# Patient Record
Sex: Female | Born: 1935 | Race: Black or African American | Hispanic: No | Marital: Single | Smoking: Former smoker
Health system: Southern US, Community
[De-identification: ages and names within clinical notes are randomized; demographics above are authoritative.]

## PROBLEM LIST (undated history)

## (undated) DIAGNOSIS — E782 Mixed hyperlipidemia: Secondary | ICD-10-CM

## (undated) DIAGNOSIS — N186 End stage renal disease: Secondary | ICD-10-CM

## (undated) DIAGNOSIS — I2510 Atherosclerotic heart disease of native coronary artery without angina pectoris: Secondary | ICD-10-CM

## (undated) DIAGNOSIS — Z8679 Personal history of other diseases of the circulatory system: Secondary | ICD-10-CM

## (undated) DIAGNOSIS — E119 Type 2 diabetes mellitus without complications: Secondary | ICD-10-CM

## (undated) DIAGNOSIS — I10 Essential (primary) hypertension: Secondary | ICD-10-CM

## (undated) DIAGNOSIS — Z992 Dependence on renal dialysis: Secondary | ICD-10-CM

## (undated) DIAGNOSIS — D649 Anemia, unspecified: Secondary | ICD-10-CM

---

## 2001-12-24 HISTORY — PX: CORONARY ARTERY BYPASS GRAFT: SHX141

## 2001-12-24 HISTORY — PX: OTHER SURGICAL HISTORY: SHX169

## 2002-01-01 ENCOUNTER — Emergency Department (HOSPITAL_COMMUNITY): Admission: EM | Admit: 2002-01-01 | Discharge: 2002-01-01 | Payer: Self-pay | Admitting: Emergency Medicine

## 2002-01-06 ENCOUNTER — Inpatient Hospital Stay (HOSPITAL_COMMUNITY): Admission: AD | Admit: 2002-01-06 | Discharge: 2002-01-28 | Payer: Self-pay | Admitting: Internal Medicine

## 2002-01-07 ENCOUNTER — Encounter: Payer: Self-pay | Admitting: Internal Medicine

## 2002-01-16 ENCOUNTER — Encounter: Payer: Self-pay | Admitting: *Deleted

## 2002-01-22 ENCOUNTER — Encounter: Payer: Self-pay | Admitting: Surgery

## 2002-01-23 ENCOUNTER — Encounter: Payer: Self-pay | Admitting: Surgery

## 2002-01-24 ENCOUNTER — Encounter: Payer: Self-pay | Admitting: Surgery

## 2002-01-25 ENCOUNTER — Encounter: Payer: Self-pay | Admitting: Cardiothoracic Surgery

## 2002-02-11 ENCOUNTER — Ambulatory Visit (HOSPITAL_COMMUNITY): Admission: RE | Admit: 2002-02-11 | Discharge: 2002-02-11 | Payer: Self-pay | Admitting: Cardiology

## 2002-07-13 ENCOUNTER — Ambulatory Visit (HOSPITAL_COMMUNITY): Admission: RE | Admit: 2002-07-13 | Discharge: 2002-07-13 | Payer: Self-pay | Admitting: Surgery

## 2002-07-13 ENCOUNTER — Encounter: Payer: Self-pay | Admitting: Surgery

## 2002-08-06 ENCOUNTER — Encounter (HOSPITAL_COMMUNITY): Admission: RE | Admit: 2002-08-06 | Discharge: 2002-09-05 | Payer: Self-pay | Admitting: Nephrology

## 2002-09-10 ENCOUNTER — Encounter (HOSPITAL_COMMUNITY): Admission: RE | Admit: 2002-09-10 | Discharge: 2002-10-10 | Payer: Self-pay | Admitting: Nephrology

## 2002-09-18 ENCOUNTER — Encounter: Payer: Self-pay | Admitting: Anesthesiology

## 2002-09-21 ENCOUNTER — Ambulatory Visit (HOSPITAL_COMMUNITY): Admission: RE | Admit: 2002-09-21 | Discharge: 2002-09-21 | Payer: Self-pay | Admitting: Ophthalmology

## 2002-10-06 ENCOUNTER — Ambulatory Visit (HOSPITAL_COMMUNITY): Admission: RE | Admit: 2002-10-06 | Discharge: 2002-10-06 | Payer: Self-pay | Admitting: Otolaryngology

## 2002-10-06 ENCOUNTER — Encounter: Payer: Self-pay | Admitting: Otolaryngology

## 2002-10-15 ENCOUNTER — Encounter (HOSPITAL_COMMUNITY): Admission: RE | Admit: 2002-10-15 | Discharge: 2002-11-14 | Payer: Self-pay | Admitting: Nephrology

## 2002-11-18 ENCOUNTER — Encounter (HOSPITAL_COMMUNITY): Admission: RE | Admit: 2002-11-18 | Discharge: 2002-12-18 | Payer: Self-pay | Admitting: Nephrology

## 2002-12-23 ENCOUNTER — Encounter (HOSPITAL_COMMUNITY): Admission: RE | Admit: 2002-12-23 | Discharge: 2003-01-22 | Payer: Self-pay | Admitting: Oncology

## 2002-12-24 HISTORY — PX: EYE SURGERY: SHX253

## 2002-12-24 HISTORY — PX: OTHER SURGICAL HISTORY: SHX169

## 2003-01-25 ENCOUNTER — Ambulatory Visit (HOSPITAL_COMMUNITY): Admission: RE | Admit: 2003-01-25 | Discharge: 2003-01-25 | Payer: Self-pay | Admitting: Ophthalmology

## 2003-01-29 ENCOUNTER — Encounter (HOSPITAL_COMMUNITY): Admission: RE | Admit: 2003-01-29 | Discharge: 2003-02-28 | Payer: Self-pay | Admitting: Nephrology

## 2003-03-04 ENCOUNTER — Encounter (HOSPITAL_COMMUNITY): Admission: RE | Admit: 2003-03-04 | Discharge: 2003-04-03 | Payer: Self-pay | Admitting: Nephrology

## 2003-03-04 ENCOUNTER — Encounter: Admission: RE | Admit: 2003-03-04 | Discharge: 2003-03-04 | Payer: Self-pay | Admitting: Nephrology

## 2003-04-08 ENCOUNTER — Encounter (HOSPITAL_COMMUNITY): Admission: RE | Admit: 2003-04-08 | Discharge: 2003-05-08 | Payer: Self-pay | Admitting: Nephrology

## 2003-05-20 ENCOUNTER — Encounter (HOSPITAL_COMMUNITY): Admission: RE | Admit: 2003-05-20 | Discharge: 2003-06-19 | Payer: Self-pay | Admitting: Nephrology

## 2003-06-29 ENCOUNTER — Encounter (HOSPITAL_COMMUNITY): Admission: RE | Admit: 2003-06-29 | Discharge: 2003-07-29 | Payer: Self-pay | Admitting: Nephrology

## 2003-08-05 ENCOUNTER — Encounter (HOSPITAL_COMMUNITY): Admission: RE | Admit: 2003-08-05 | Discharge: 2003-09-04 | Payer: Self-pay | Admitting: Oncology

## 2003-09-03 ENCOUNTER — Encounter: Payer: Self-pay | Admitting: Emergency Medicine

## 2003-09-03 ENCOUNTER — Inpatient Hospital Stay (HOSPITAL_COMMUNITY): Admission: EM | Admit: 2003-09-03 | Discharge: 2003-09-06 | Payer: Self-pay | Admitting: Emergency Medicine

## 2003-09-09 ENCOUNTER — Encounter (HOSPITAL_COMMUNITY): Admission: RE | Admit: 2003-09-09 | Discharge: 2003-10-09 | Payer: Self-pay | Admitting: Nephrology

## 2003-10-14 ENCOUNTER — Encounter (HOSPITAL_COMMUNITY): Admission: RE | Admit: 2003-10-14 | Discharge: 2003-11-13 | Payer: Self-pay | Admitting: Nephrology

## 2003-10-25 ENCOUNTER — Ambulatory Visit (HOSPITAL_COMMUNITY): Admission: RE | Admit: 2003-10-25 | Discharge: 2003-10-25 | Payer: Self-pay | Admitting: Ophthalmology

## 2003-12-25 HISTORY — PX: EYE SURGERY: SHX253

## 2004-02-07 ENCOUNTER — Ambulatory Visit (HOSPITAL_COMMUNITY): Admission: RE | Admit: 2004-02-07 | Discharge: 2004-02-08 | Payer: Self-pay | Admitting: Ophthalmology

## 2004-05-25 ENCOUNTER — Encounter (HOSPITAL_COMMUNITY): Admission: RE | Admit: 2004-05-25 | Discharge: 2004-06-24 | Payer: Self-pay | Admitting: Nephrology

## 2004-06-29 ENCOUNTER — Encounter (HOSPITAL_COMMUNITY): Admission: RE | Admit: 2004-06-29 | Discharge: 2004-07-29 | Payer: Self-pay | Admitting: Nephrology

## 2004-08-02 ENCOUNTER — Ambulatory Visit (HOSPITAL_COMMUNITY): Admission: RE | Admit: 2004-08-02 | Discharge: 2004-08-02 | Payer: Self-pay | Admitting: Family Medicine

## 2004-08-03 ENCOUNTER — Encounter (HOSPITAL_COMMUNITY): Admission: RE | Admit: 2004-08-03 | Discharge: 2004-09-02 | Payer: Self-pay | Admitting: Nephrology

## 2004-08-16 ENCOUNTER — Ambulatory Visit (HOSPITAL_COMMUNITY): Admission: RE | Admit: 2004-08-16 | Discharge: 2004-08-16 | Payer: Self-pay | Admitting: Family Medicine

## 2004-09-07 ENCOUNTER — Encounter (HOSPITAL_COMMUNITY): Admission: RE | Admit: 2004-09-07 | Discharge: 2004-09-22 | Payer: Self-pay | Admitting: Nephrology

## 2004-09-19 ENCOUNTER — Emergency Department (HOSPITAL_COMMUNITY): Admission: EM | Admit: 2004-09-19 | Discharge: 2004-09-19 | Payer: Self-pay | Admitting: *Deleted

## 2004-09-28 ENCOUNTER — Encounter (HOSPITAL_COMMUNITY): Admission: RE | Admit: 2004-09-28 | Discharge: 2004-10-28 | Payer: Self-pay | Admitting: Nephrology

## 2004-11-02 ENCOUNTER — Ambulatory Visit (HOSPITAL_COMMUNITY): Payer: Self-pay | Admitting: Nephrology

## 2004-11-02 ENCOUNTER — Encounter (HOSPITAL_COMMUNITY): Admission: RE | Admit: 2004-11-02 | Discharge: 2004-12-02 | Payer: Self-pay | Admitting: Nephrology

## 2004-11-09 ENCOUNTER — Ambulatory Visit: Payer: Self-pay | Admitting: Gastroenterology

## 2004-11-20 ENCOUNTER — Ambulatory Visit (HOSPITAL_COMMUNITY): Admission: RE | Admit: 2004-11-20 | Discharge: 2004-11-20 | Payer: Self-pay | Admitting: Internal Medicine

## 2004-11-20 ENCOUNTER — Ambulatory Visit: Payer: Self-pay | Admitting: Internal Medicine

## 2004-11-27 ENCOUNTER — Ambulatory Visit (HOSPITAL_COMMUNITY): Admission: RE | Admit: 2004-11-27 | Discharge: 2004-11-27 | Payer: Self-pay | Admitting: Ophthalmology

## 2004-12-08 ENCOUNTER — Encounter (HOSPITAL_COMMUNITY): Admission: RE | Admit: 2004-12-08 | Discharge: 2005-01-07 | Payer: Self-pay | Admitting: Nephrology

## 2005-01-04 ENCOUNTER — Ambulatory Visit (HOSPITAL_COMMUNITY): Payer: Self-pay | Admitting: Nephrology

## 2005-01-11 ENCOUNTER — Encounter (HOSPITAL_COMMUNITY): Admission: RE | Admit: 2005-01-11 | Discharge: 2005-02-10 | Payer: Self-pay | Admitting: Nephrology

## 2005-02-16 ENCOUNTER — Encounter (HOSPITAL_COMMUNITY): Admission: RE | Admit: 2005-02-16 | Discharge: 2005-03-18 | Payer: Self-pay | Admitting: Nephrology

## 2005-03-01 ENCOUNTER — Ambulatory Visit (HOSPITAL_COMMUNITY): Payer: Self-pay | Admitting: Nephrology

## 2005-03-22 ENCOUNTER — Encounter (HOSPITAL_COMMUNITY): Admission: RE | Admit: 2005-03-22 | Discharge: 2005-04-21 | Payer: Self-pay | Admitting: Nephrology

## 2005-09-13 ENCOUNTER — Ambulatory Visit (HOSPITAL_COMMUNITY): Admission: RE | Admit: 2005-09-13 | Discharge: 2005-09-13 | Payer: Self-pay | Admitting: Nephrology

## 2005-09-20 ENCOUNTER — Ambulatory Visit (HOSPITAL_COMMUNITY): Admission: RE | Admit: 2005-09-20 | Discharge: 2005-09-20 | Payer: Self-pay | Admitting: Nephrology

## 2005-12-24 HISTORY — PX: OTHER SURGICAL HISTORY: SHX169

## 2006-05-29 ENCOUNTER — Ambulatory Visit (HOSPITAL_COMMUNITY): Admission: RE | Admit: 2006-05-29 | Discharge: 2006-05-29 | Payer: Self-pay | Admitting: Nephrology

## 2006-05-30 ENCOUNTER — Ambulatory Visit (HOSPITAL_COMMUNITY): Admission: RE | Admit: 2006-05-30 | Discharge: 2006-05-30 | Payer: Self-pay | Admitting: Vascular Surgery

## 2006-06-03 ENCOUNTER — Ambulatory Visit (HOSPITAL_COMMUNITY): Admission: RE | Admit: 2006-06-03 | Discharge: 2006-06-03 | Payer: Self-pay | Admitting: Vascular Surgery

## 2006-07-04 ENCOUNTER — Ambulatory Visit (HOSPITAL_COMMUNITY): Admission: RE | Admit: 2006-07-04 | Discharge: 2006-07-04 | Payer: Self-pay | Admitting: Family Medicine

## 2006-08-12 ENCOUNTER — Inpatient Hospital Stay (HOSPITAL_COMMUNITY): Admission: EM | Admit: 2006-08-12 | Discharge: 2006-08-15 | Payer: Self-pay | Admitting: Emergency Medicine

## 2006-08-13 ENCOUNTER — Ambulatory Visit: Payer: Self-pay | Admitting: Gastroenterology

## 2006-08-29 ENCOUNTER — Ambulatory Visit: Payer: Self-pay | Admitting: Internal Medicine

## 2006-08-29 ENCOUNTER — Ambulatory Visit (HOSPITAL_COMMUNITY): Admission: RE | Admit: 2006-08-29 | Discharge: 2006-08-29 | Payer: Self-pay | Admitting: Internal Medicine

## 2006-09-12 ENCOUNTER — Ambulatory Visit (HOSPITAL_COMMUNITY): Admission: RE | Admit: 2006-09-12 | Discharge: 2006-09-12 | Payer: Self-pay | Admitting: Internal Medicine

## 2006-09-12 ENCOUNTER — Encounter (INDEPENDENT_AMBULATORY_CARE_PROVIDER_SITE_OTHER): Payer: Self-pay | Admitting: *Deleted

## 2006-10-21 ENCOUNTER — Ambulatory Visit (HOSPITAL_COMMUNITY): Admission: RE | Admit: 2006-10-21 | Discharge: 2006-10-21 | Payer: Self-pay | Admitting: Vascular Surgery

## 2006-12-24 HISTORY — PX: OTHER SURGICAL HISTORY: SHX169

## 2007-02-07 ENCOUNTER — Ambulatory Visit: Payer: Self-pay | Admitting: Vascular Surgery

## 2007-02-07 ENCOUNTER — Ambulatory Visit (HOSPITAL_COMMUNITY): Admission: RE | Admit: 2007-02-07 | Discharge: 2007-02-07 | Payer: Self-pay | Admitting: Vascular Surgery

## 2007-02-18 ENCOUNTER — Ambulatory Visit (HOSPITAL_COMMUNITY): Admission: RE | Admit: 2007-02-18 | Discharge: 2007-02-18 | Payer: Self-pay | Admitting: Vascular Surgery

## 2007-06-30 ENCOUNTER — Ambulatory Visit (HOSPITAL_COMMUNITY): Admission: RE | Admit: 2007-06-30 | Discharge: 2007-06-30 | Payer: Self-pay | Admitting: Nephrology

## 2007-07-01 ENCOUNTER — Ambulatory Visit: Payer: Self-pay | Admitting: Vascular Surgery

## 2007-07-01 ENCOUNTER — Ambulatory Visit (HOSPITAL_COMMUNITY): Admission: RE | Admit: 2007-07-01 | Discharge: 2007-07-01 | Payer: Self-pay | Admitting: Vascular Surgery

## 2007-08-08 ENCOUNTER — Ambulatory Visit: Payer: Self-pay | Admitting: Vascular Surgery

## 2007-08-18 ENCOUNTER — Ambulatory Visit (HOSPITAL_COMMUNITY): Admission: RE | Admit: 2007-08-18 | Discharge: 2007-08-18 | Payer: Self-pay | Admitting: Vascular Surgery

## 2007-08-18 ENCOUNTER — Ambulatory Visit: Payer: Self-pay | Admitting: *Deleted

## 2007-12-29 IMAGING — CR DG ANG/EXT/UNI/OR LEFT
1 series · 1 of 1 positions shown · non-contrast
Comparison: none

CLINICAL DATA: Arteriogram, clotted graft. 
 PORTABLE INTRAOPERATIVE ANGIOGRAM ? 10/21/06:

[view not recorded]
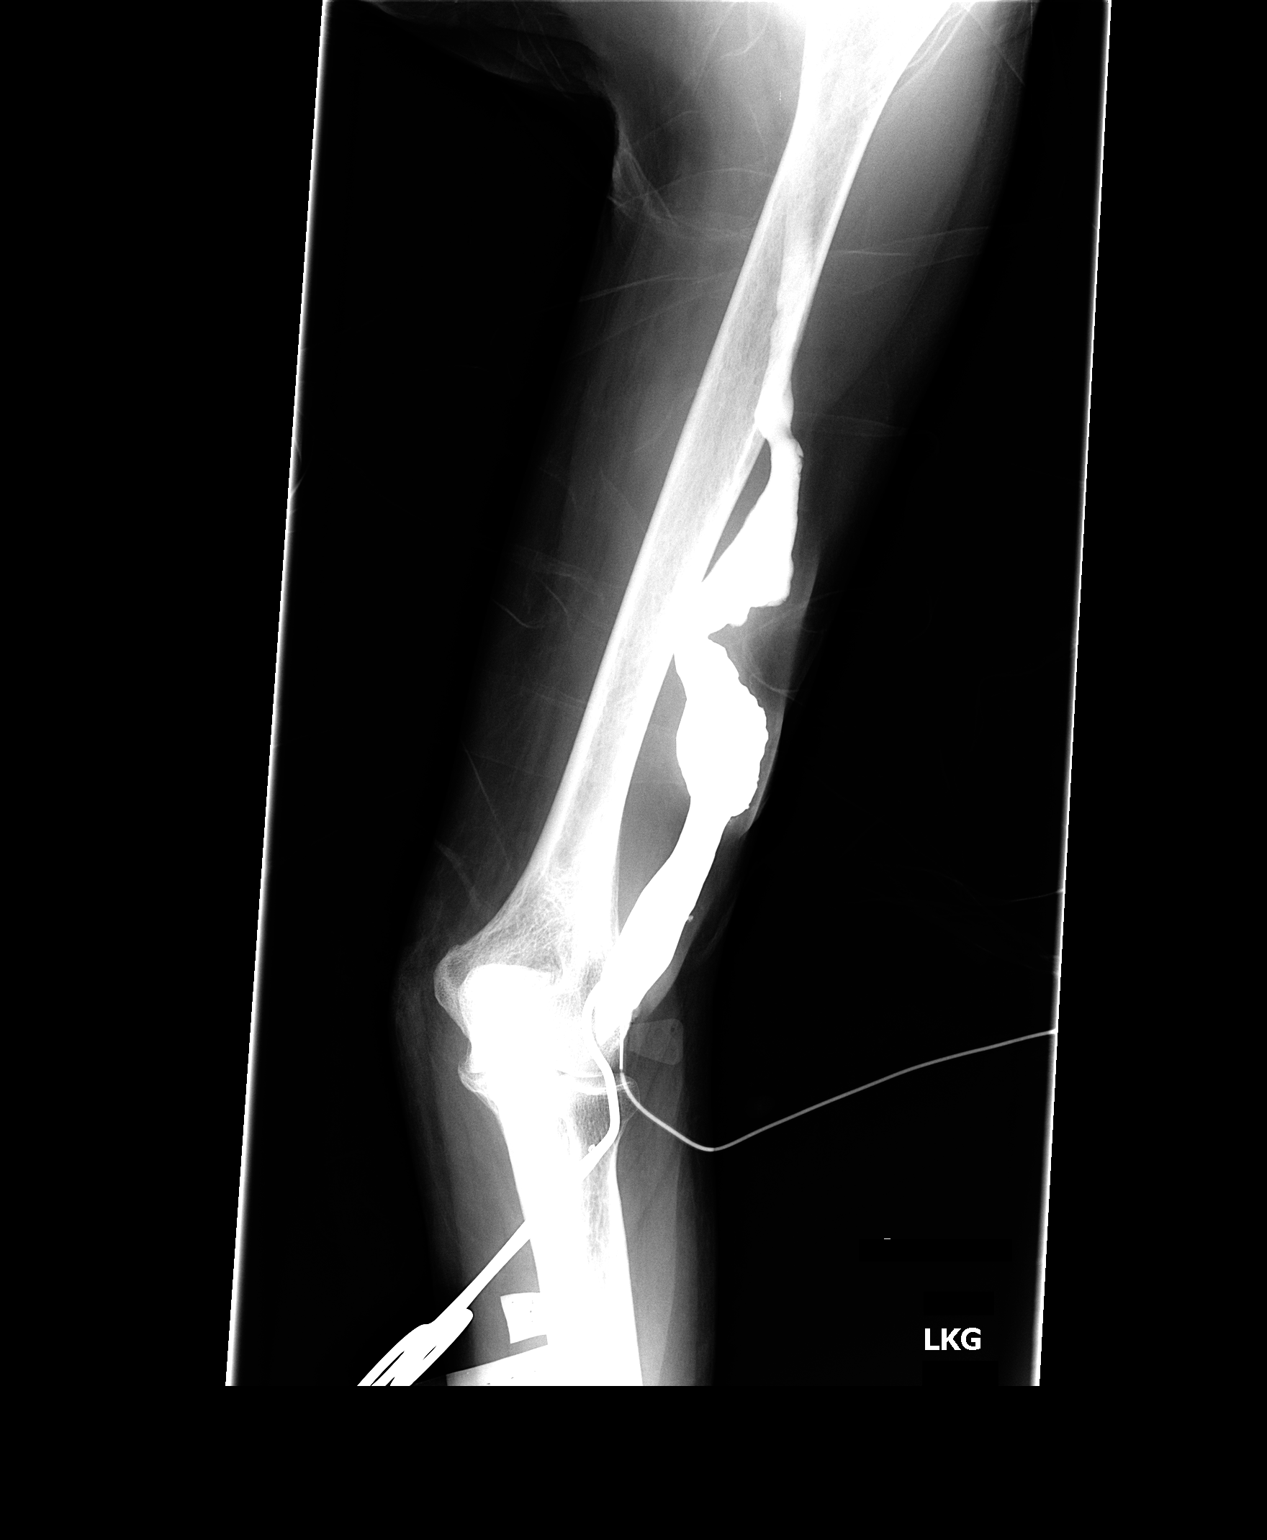

[1 of 1 positions shown; findings below may reference images not displayed]

FINDINGS: A single image demonstrates access of a graft with retrograde flow of contrast through what appears to be the venous portion of the AV graft toward the axilla.  Although there is tortuosity of the graft, it is patent.
IMPRESSION: Retrograde flow of contrast apparently within the venous outflow portion of an AV graft.

## 2008-01-23 ENCOUNTER — Ambulatory Visit (HOSPITAL_COMMUNITY): Admission: RE | Admit: 2008-01-23 | Discharge: 2008-01-23 | Payer: Self-pay | Admitting: General Surgery

## 2011-01-14 ENCOUNTER — Encounter: Payer: Self-pay | Admitting: Family Medicine

## 2011-05-08 NOTE — Op Note (Signed)
NAME:  Barbara Cummings, Barbara Cummings                ACCOUNT NO.:  [id number]   MEDICAL RECORD NO.:  [id number]          PATIENT TYPE:  AMB   LOCATION:  SDS                          FACILITY:  [facility]   PHYSICIAN:  Balinda Quails, M.D.    DATE OF BIRTH:  08/03/1927   DATE OF PROCEDURE:  08/18/2007  DATE OF DISCHARGE:  08/18/2007                               OPERATIVE REPORT   SURGEON:  Balinda Quails, M.D.   ASSISTANT:  Nurse.   ANESTHETIC:  Local with MAC.   ANESTHESIOLOGIST:  Smith.   PREOPERATIVE DIAGNOSIS:  Endstage renal failure.   POSTOPERATIVE DIAGNOSIS:  Endstage renal failure.   PROCEDURE.:  1. Right brachiocephalic arteriovenous fistula.  2. Exchange of right internal jugular Diatek catheter.   OPERATIVE PROCEDURE:  The patient was brought to the operating room in  stable condition.  Placed in supine position.  Right arm prepped and  draped in a sterile fashion.  Skin and subcutaneous tissue instilled  with 1% Xylocaine with epinephrine.  Transverse skin incision made  through the right antecubital fossa.  The right cephalic vein and  antecubital network were all dissected out.  The cephalic vein isolated  with tributaries ligated with silk ties and divided.  Deep to the  cephalic vein the brachial artery was exposed.  This was followed down  to the origin of the radial and ulnar arteries which were each encircled  with vessel loops.  The patient was administered 2000 units of heparin  intravenously.   The brachial artery controlled proximally and distally with bulldog  clamps.  A longitudinal arteriotomy made.  The cephalic vein divided,  ligated distally with 2-0 silk.  Dilated proximally.  Anastomosed end-to-  side to the brachial artery using running 7-0 Prolene suture.  Clamps  were then removed.  Excellent flow present.  Adequate hemostasis  obtained.  Sponge and instrument counts correct.   Subcutaneous tissue closed with running 3-0 Vicryl suture.  Skin closed  with 4-0  Monocryl.  Dermabond applied.   The right neck and chest were then prepped and draped in a sterile  fashion.  Skin and subcutaneous tissues instilled with 1% Xylocaine.  A  transverse skin incision was made over the tunnel of the right internal  jugular Diatek catheter.  The tunnel was opened.  The catheter clamped  with a hemostat and divided and the distal segment removed.  The  proximal segment was wired and the proximal segment entirely removed.  The wire was now in the superior vena cava.  A 16-French tearaway sheath  and dilator were advanced over the guidewire, the dilator and guidewire  removed.  A new Diatek catheter was placed through the sheath to the  superior vena cava right atrial junction.  The sheath removed.  Subcutaneous tunnel created, and the catheter brought through the  tunnel.  Divided the hub mechanism assembled, flushed with heparin  saline and capped with heparin.   Insertion site closed with interrupted 3-0 nylon suture.  Catheter  affixed to the skin with interrupted 2-0 silk suture.  Sterile dressings  applied.  The patient  tolerated the procedure well.  No apparent  complications.  Transferred to the recovery room in stable condition.      Balinda Quails, M.D.  Electronically Signed     PGH/MEDQ  D:  08/18/2007  T:  08/19/2007  Job:  [id number]

## 2011-05-08 NOTE — Procedures (Signed)
CEPHALIC VEIN MAPPING   INDICATION:  Evaluation of right cephalic vein prior to placement of  dialysis access.   HISTORY:  Thrombosed left AV fistula.  The patient currently dialyzes via a right  subclavian catheter.   EXAM:  Right cephalic vein is compressible.   Diameter measurements range from 0.15 cm to 0.26 cm.   Left cephalic vein not evaluated.   IMPRESSION:  Patent right cephalic vein which is of acceptable diameter  for use as dialysis access site.   The forearm portion of the cephalic vein appears slightly larger than  the upper arm portion of vein.   ___________________________________________  Larina Earthly, M.D.   MC/MEDQ  D:  08/08/2007  T:  08/09/2007  Job:  [id number]

## 2011-05-08 NOTE — H&P (Signed)
NAME:  Barbara Cummings, [place] NO.:  [id number]   MEDICAL RECORD NO.:  [id number]           PATIENT TYPE:   LOCATION:  DAY                           FACILITY:  [facility]   PHYSICIAN:  Tilford Pillar, MD      DATE OF BIRTH:  08/03/1927   DATE OF ADMISSION:  DATE OF DISCHARGE:  LH                              HISTORY & PHYSICAL   CHIEF COMPLAINT:  End-stage renal disease with a Perma-Cath.   HISTORY OF PRESENT ILLNESS:  The patient is a 77 year old female with a  history of end-stage renal disease, diabetes mellitus, coronary artery  disease, and hypercholesterolemia.  She had a prior left arm AV shunt  that became dilated and was no longer satisfactory for Monday,  Wednesday, Friday dialysis appointments.  Because it had become non  functional, she had a Perma-Cath placed into the right internal jugular  vein, approximately in August at which time her right arm had an AV  shunt created.  While waiting for maturation of the shunt, she continued  her dialysis through the Perma-Cath.  At this point her shunt has  matured to the point of utilization.  She has now been using the shunt  at dialysis and has not been utilizing the Perma-Cath other than for  occasional flushing's.  At this time patient presents for removal.   PAST MEDICAL HISTORY:  1. End-stage renal disease.  2. Coronary artery disease.  3. Diabetes mellitus.  4. Hypercholesterolemia.   SURGICAL HISTORY:  1. She had a CABG in 2003.  2. She had AV shunts in bilateral arms.  3. She had placement of a Perma-Cath in August, 2008.   MEDICATIONS:  1. Dilantin.  2. Simvastatin.  3. Actos.  4. Renagel.  5. Glipizide.  She is not on any anticoagulation.   ALLERGIES:  No known drug allergies.   SOCIAL HISTORY:  No tobacco use, no alcohol use or drug use.   REVIEW OF SYSTEMS:  Unremarkable in all systems.  She is anuric.   PHYSICAL EXAMINATION:  GENERAL:  The patient is a somewhat disheveled,  moderately obese  African-American female in no acute distress.  HEENT:  Scalp - no deformities, no masses. Eyes are somewhat  protuberant, pupils are equal, round and reactive, extraocular movements  are intact.  No scleral icterus is noted. Oral mucosa is pink. Normal occlusion.  NECK:  Trachea is midline. No cervical lymphadenopathy is appreciated.  LUNGS:  Unlabored respirations, clear to auscultation bilaterally.  CARDIOVASCULAR:  Regular rate and rhythm, she has 2+ radial pulses  bilaterally.  She has a palpable thrill in the right arm AV shunt. She  has a pulsatile flow through the left AV shunt with no thrill  appreciated.  CHEST WALL:  Evaluation notes she does have a Perma-Cath in the right  subclavicular region, the subcuticular tunneling of this appears to  enter into the lower aspect of the internal jugular vein on the right  side.   ASSESSMENT AND PLAN:  1. End stage renal disease, Perma-Cath which now requires removal. At      this point it  was discussed with the patient need for removal as      well as the risks, benefits and alternatives, including the      possible risk of bleeding.  Patient understands the risks and      wishes to proceed.  At this point we will attempt to get this      scheduled following a course of her dialysis.  Additionally she was      advised to notify her dialysis appointment to minimize heparin      usage for the anticipated Perma-Cath removal tomorrow.      Tilford Pillar, MD  Electronically Signed     BZ/MEDQ  D:  01/22/2008  T:  01/22/2008  Job:  [id number]   cc:   Ninetta Lights D. Felecia Shelling, MD  Fax: [phone]   SHORT STAY

## 2011-05-08 NOTE — Assessment & Plan Note (Signed)
OFFICE VISIT   Barbara Cummings, Barbara Cummings  DOB:  08/03/1927                                       08/08/2007  XBJYN#:[id number]   REASON FOR VISIT:  Evaluation for AV access.   She is a pleasant, 77 year old, black female whose been on renal  dialysis for several years. She had a left upper arm AV fistula created  approximately 5 years ago and has had a recent thrombosis and failure of  this. She has had a diatek catheter placed by Dr. Edilia Bo on July 01, 2007 which is working without difficulty. She does have radial pulses  bilaterally. She has a moderate size antecubital vein by physical exam.  She underwent duplex today showing patency of her cephalic vein which is  relatively small at the wrist and has a moderate size at the antecubital  space proximally. I recommended that we explore her right arm to  anticipate fistula on her right side since she has had a good result  from the left side. We will plan for surgery on a nondialysis on August  28 at [facility].   Larina Earthly, M.D.  Electronically Signed   TFE/MEDQ  D:  08/08/2007  T:  08/11/2007  Job:  [id number]

## 2011-05-08 NOTE — Op Note (Signed)
NAME:  Barbara Cummings, Barbara Cummings                ACCOUNT NO.:  [id number]   MEDICAL RECORD NO.:  [id number]          PATIENT TYPE:  AMB   LOCATION:  SDS                          FACILITY:  [facility]   PHYSICIAN:  Di Kindle. Edilia Bo, M.D.DATE OF BIRTH:  1926-04-27   DATE OF PROCEDURE:  07/01/2007  DATE OF DISCHARGE:                               OPERATIVE REPORT   PREOPERATIVE DIAGNOSIS:  Chronic renal failure.   POSTOPERATIVE DIAGNOSIS:  Chronic renal failure.   PROCEDURE:  Ultrasound-guided placement of a right IJ Diatek catheter.   SURGEON:  Di Kindle. Edilia Bo, M.D.   ASSISTANT:  Nurse.   ANESTHESIA:  Local with sedation.   TECHNIQUE:  The patient was taken to the operating room and sedated by  anesthesia.  The ultrasound scanner was used to mark both internal  jugular veins, both of which appeared to be patent.  The neck and upper  chest were then prepped and draped in the usual sterile fashion.   After the skin was infiltrated with 1% lidocaine, the right IJ was  cannulated and a guidewire introduced into the superior vena cava under  fluoroscopic control.  The tract over the wire was dilated, and then a  dilator and peel-away sheath were passed over the wire and the wire and  dilator removed.  The catheter was passed through the peel-away sheath  and positioned in the right atrium.  The exit site for the catheter was  selected and the skin anesthetized between the two areas.  The catheter  was then brought through the tunnel, cut to the appropriate length, and  the distal ports were attached.  Both ports withdrew easily, were then  flushed with heparinized saline and filled with concentrated heparin.  The catheter was secured at its exit site with a 3-0 nylon suture.  The  IJ cannulation site was closed with 4-0 subcuticular stitch.  A sterile  dressing was applied.   The patient tolerated the procedure well and was transferred to the  recovery room in satisfactory condition.   All needle and sponge counts  were correct.      Di Kindle. Edilia Bo, M.D.  Electronically Signed     CSD/MEDQ  D:  07/01/2007  T:  07/01/2007  Job:  [id number]

## 2011-05-11 NOTE — Op Note (Signed)
NAME:  Barbara Cummings, Barbara Cummings                            ACCOUNT NO.:  [id number]   MEDICAL RECORD NO.:  [id number]                   PATIENT TYPE:  OIB   LOCATION:  2550                                 FACILITY:  [facility]   PHYSICIAN:  Alford Highland. Rankin, M.D.                DATE OF BIRTH:  08/03/1927   DATE OF PROCEDURE:  02/07/2004  DATE OF DISCHARGE:                                 OPERATIVE REPORT   PREOPERATIVE DIAGNOSIS:  1. Tractional detachment left eye.  2. Combined rhegmatogenal detachment left eye.  3. Progressive proliferative diabetic retinopathy of the left eye     threatening macular detachment.   POSTOPERATIVE DIAGNOSIS:  1. Tractional detachment left eye.  2. Combined rhegmatogenal detachment left eye.  3. Progressive proliferative diabetic retinopathy of the left eye     threatening macular detachment.   PROCEDURE:  1. Posterior vitrectomy with membrane peel left eye.  2. Endolaser pan photocoagulation left eye.  3. Injection of vitreous substitute - C3F8 12% left eye.   SURGEON:  Alford Highland. Rankin, M.D.   ANESTHESIA:  General endotracheal.   INDICATIONS FOR PROCEDURE:  The patient is a 77 year old woman with profound  vision loss of the left eye on the basis of neglected and progressive  proliferative diabetic retinopathy of the left eye, neovascular tissues  causing tractional detachment of the thready macular region and causing  detachments nasally and inferiorly.  This is an attempt to release the  tractional areas.  The patient understands the risks of anesthesia including  rare occurrence of death, but also to the eye including, but not limited to  hemorrhage, infection, scarring, need for another surgery, no change in  vision, loss of vision, and progressive disease despite intervention.  Appropriate signed consent was obtained.  She was taken to the operating  room.   DESCRIPTION OF PROCEDURE:  In the operating room, appropriate monitoring was  followed by general  endotracheal anesthesia.  The left periocular region was  thoroughly prepped and draped in the usual ophthalmic fashion.  Lid speculum  applied.  Conjunctival peritomy was then fashioned in each of the quadrants  with the exception of the inferonasal.  4 mm infusion secured 3.5 mm  posterior to the limbus.  Placement in the vitreous cavity verified  visually.  Superior sclerotomies were fashioned.  Wild microscope was placed  into position with the Biome attachment.  Core vitrectomy was then begun.  Modified en block technique was then used in a delamination of the  neovascular tissues along the optic nerve and temporal arcades.  Dense  attachments were noted nasally.  The neovascular tissue and the old fibrotic  tissue over the optic nerve were identified.  Radial incisions were made in  the remainder of the posterior hyaloidal remnants in the quadrants extending  from the 10 o'clock position to the 7 o'clock position because of their  dense adherent nature to  what appeared to be necrotic retina.  A small  retinotomy was fashioned nasal to the optic nerve for internal drainage of  subretinal fluid.  The remainder of the peripheral vitreous had been trimmed  and excised 360 disease.  The macular area was freed from its tractional  detachments.   At this time, fluid/air exchange was then completed.  The retina nicely  reattached.  Endolaser photocoagulation was placed 360 degrees.  An air-C3F8  exchange was completed using a concentration of 12% C3F8.  The instruments  were removed from the eye. Superior sclerotomies were closed with 7-0  Vicryl.  The infusion removed and similarly closed with 7-0 Vicryl as well  as the conjunctiva.  Subconjunctival injections of antibiotic steroid  applied.  Sterile patch and Fox shield applied.  Intraocular pressure had  been assessed and found to be adequate. The patient was taken to the  recovery room in good stable condition.                                                Alford Highland Rankin, M.D.    GAR/MEDQ  D:  02/07/2004  T:  02/07/2004  Job:  [id number]

## 2011-05-11 NOTE — Op Note (Signed)
NAME:  Barbara Cummings, Barbara Cummings                ACCOUNT NO.:  [id number]   MEDICAL RECORD NO.:  [id number]          PATIENT TYPE:  AMB   LOCATION:  SDS                          FACILITY:  [facility]   PHYSICIAN:  Quita Skye. Hart Rochester, M.D.  DATE OF BIRTH:  08/03/1927   DATE OF PROCEDURE:  10/21/2006  DATE OF DISCHARGE:  10/21/2006                                 OPERATIVE REPORT   PREOPERATIVE DIAGNOSIS:  Thrombosed left upper arm AV fistula.   POSTOPERATIVE DIAGNOSIS:  Thrombosed left upper arm AV fistula.   OPERATIONS:  Thrombectomy, left upper arm AV fistula with intraoperative  fistulogram.   SURGEON:  Quita Skye. Hart Rochester, M.D.   FIRST ASSISTANT:  Jerold Coombe, P.A.   ANESTHESIA:  Local.   PROCEDURE:  The patient was taken to the operating room, placed in the  supine position at which time the left upper extremity was prepped with  Betadine scrub and solution and draped in a routine sterile manner.  After  infiltration of 1% Xylocaine with epinephrine.  Incision was made through  the previous scar in the left upper arm.  This was in the distal aspect of  the upper arm overlying the cephalic vein to brachial artery anastomosis.  The vein was quite dilated and pulsatile about 4 to 5 cm where it became  thrombosed.  There were two areas of or significant dilatation which were  also thrombosed to the shoulder level.  Transverse opening was made in the  very proximal portion of the cephalic vein about 3 cm from the arterial  anastomosis after gaining proximal control.  This was filled with fresh  thrombus.  A 5 Fogarty catheter would easily traverse the fistula into the  central veins and upon return a long core of organized thrombus was  retrieved.  Additional passes yielded a little but more thrombus but  eventually there were multiple negative passes and heparin saline would be  flushed under low resistance.  The arterial anastomosis was also explored  was widely patent.  The fistula was reclosed  with continuous 6-0 Prolene  suture.  Clamp released.  There was an excellent pulse and a palpable thrill  with good Doppler flow in the fistula.  Intraoperative fistulogram was  performed which revealed the cephalic vein to be significantly dilated and  tortuous but there were no areas of significant stenosis noted.  Hemostasis  was achieved.  The wound was closed in layers with Vicryl in a subcuticular  fashion.  Sterile dressing applied.  The patient taken to recovery room in  satisfactory condition.           ______________________________  Quita Skye Hart Rochester, M.D.     JDL/MEDQ  D:  10/21/2006  T:  10/22/2006  Job:  [id number]

## 2011-05-11 NOTE — Discharge Summary (Signed)
[place]  Patient:    Barbara Cummings, Barbara Cummings Visit Number: [id number] MRN: [id number]          Service Type: MED Location: [id number] 2029 01 Attending Physician:  Cleatrice Burke Dictated by:   Areta Haber, P.A.C. Admit Date:  01/15/2002 Discharge Date: 01/28/2002   CC:         Gerrit Friends. Dietrich Pates, M.D. [facility]   Discharge Summary  HISTORY OF PRESENT ILLNESS:  This is a 77 year old woman with a history of non-insulin-dependent diabetes mellitus, hypertension, and chronic renal insufficiency who was admitted to [facility] on January 06, 2002, due to marked bilateral lower extremity edema to the knees with bilateral lower leg blisters and ulcerations with cellulitis.  She was treated with intravenous antibiotics and diuresis with improvement. An echocardiogram was obtained which showed a left ventricular ejection fraction reduced to 35 to 40% with global hypokinesis.  Also noted was trace aortic insufficiency and mild mitral regurgitation, trace tricuspid regurgitation.  On questioning, the patient denied any chest pain or pressure. She denied shortness of breath although her daughter stated that she occasionally would appear short of breath.  She also had no pain in her neck or jaw as well as none in her arms. She denied PND and/or orthopnea.  She had no palpitations. She was transferred to [facility] for further cardiac evaluation including catheterization.  PAST MEDICAL HISTORY:  Significant for type 2 diabetes mellitus for approximately five or six years and initially treated with an oral hypoglycemic agent. This was discontinued by her primary care physician due to low sugars.  Other diagnoses include hypertension for which she has been noncompliant, history of renal insufficiency diagnosis in January of 2003 where her creatinine was noted to be 3.4 with a BUN of 27. She also has a history of recently diagnosed  normocytic anemia.  MEDICATIONS PRIOR TO ADMISSION: 1. Lasix 20 mg daily. 2. Potassium chloride 10 mEq q.d. 3. Clonidine 0.1 mg b.i.d. *All started in early January.  For family history, social history, physical examination and review of systems, please see the history and physical done at the time of admission.  HOSPITAL COURSE:  The patient was transferred from [facility] to [facility] as stated.  She was taken to the cardiac catheterization lab on January 19, 2002, where she was found to have severe multivessel coronary artery disease which was quite diffuse in nature.  There was a 40% distal left main stenosis. The LAD was calcified proximally.  There was a 90% proximal and mid stenosis in the LAD. The left circumflex gave off a large bifurcating first marginal and 80% and 90% stenosis in the two sub-branches.  The continuation of left circumflex had a 50% stenosis, but was small distally. The right coronary artery had an 80% proximal and a 70% mid artery lesion. There was posterior descending coronary artery that had a 70% mid stenosis and a first posterolateral branch that had a 70% mid stenosis. The second posterolateral branch had a 70% stenosis.  The left ventriculogram was not performed due to elevated creatinine.  Due to these findings, surgical consultation was obtained with Alleen Borne, M.D., who evaluated the patient and studies and agreed with recommendations to proceed with surgical revascularization.  PROCEDURE:  On January 22, 2002, the patient was taken to the cardiac operating room where she underwent the following procedure:  Coronary artery bypass grafting x4 using left internal mammary artery  to the left anterior descending coronary artery with a saphenous vein graft to the posterior descending branch of the right coronary artery and a sequential saphenous vein graft to the first and second obtuse marginal branches of the left  circumflex coronary artery.  The patient tolerated the procedure well and was taken to the surgical intensive care unit in stable condition.  Intraoperative findings included severe diffuse calcific coronary artery disease.  POSTOPERATIVE HOSPITAL COURSE:  The patient has done quite well. She has maintained stable hemodynamics.  All routine lines, monitors and tubes have been discontinued in a stepwise manner.  Her postoperative creatinine has stabilized, most recently measured at 3.3 on January 26, 2002.  Additionally, she has had a postoperative anemia that has required transfusion.  Most recent hemoglobin and hematocrit dated January 26, 2002, are 8.5 and 24.7, respectively.  The patient will have both of these results checked prior to final decision on discharge.  Additionally, postoperatively the patient has had episode of atrial fibrillation but quickly converted back to normal sinus rhythm and is maintaining a stable rhythm for the past several days. She has tolerated routine cardiac rehabilitation phase I modalities.  Her incisions are healing well without signs of infection.  Her diabetes has been under fairly stable control but she did require a sliding scale and subsequently Actos was started for better diabetic control. She is familiar with checking her glucose at home and will continue to do so.  Currently, the patient is felt to be quite stable. She is tentatively felt ready for discharge the morning of January 28, 2002, pending morning round reevaluation.  MEDICATIONS ON DISCHARGE: 1. Aspirin 325 mg q.d. 2. Catapres 0.1 mg b.i.d. 3. Toprol XL 25 mg daily. 4. Actos 15 mg q.a.m. 5. Darvocet p.r.n.  FINAL DIAGNOSIS:  Coronary artery disease with congestive heart failure and left ventricular dysfunction.  OTHER DIAGNOSES: 1. Chronic renal insufficiency. 2. Diabetes mellitus type 2. 3. Hypertension. 4. History of recent diagnosis of bullous pemphigus. 5. History of  recent diagnosis of normocytic anemia and also postoperative    anemia.   FOLLOW-UP:  This will include Alleen Borne, M.D., in three weeks, February 17, 2002, at 9 a.m.; Gerrit Friends. Dietrich Pates, M.D., on February 10, 2002, at 10 a.m.  DISCHARGE INSTRUCTIONS: The patient will receive written instructions with regard to medications, activity, diet, wound care, and follow-up. Dictated by:   Areta Haber, P.A.C. Attending Physician:  Cleatrice Burke DD:  01/27/02 TD:  01/28/02 Job: [id number]

## 2011-05-11 NOTE — Op Note (Signed)
   NAME:  Barbara Cummings, Barbara Cummings                            ACCOUNT NO.:  [id number]   MEDICAL RECORD NO.:  [id number]                   PATIENT TYPE:  INP   LOCATION:  A208                                 FACILITY:  [facility]   PHYSICIAN:  Dirk Dress. Katrinka Blazing, M.D.                DATE OF BIRTH:  08/03/1927   DATE OF PROCEDURE:  DATE OF DISCHARGE:                                 OPERATIVE REPORT   PREOPERATIVE DIAGNOSIS:  Perirectal abscess.   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   PROCEDURE:  Wide excision of pilonidal disease with marsupialization.   SURGEON:  Dirk Dress. Katrinka Blazing, M.D.   DESCRIPTION:  Under spinal anesthesia the patient's presacral and perianal  area were prepped and draped in a sterile field. A probe was placed in the  superior most portion of the inflammatory process overlying the upper sacrum  and lower lumbar area.  The probe extended down to the draining fistulous  tract in the posterior rectal space.  Using electrocautery and staying away  from the inflamed tissue the area of disease was widely excised down to  normal tissue including fat and fascia.  None of the infected tissue  contaminated the operative site.  In the lower margins in the perirectal  space, however, there was always the potential for contamination because of  stool.   The area was irrigated and hemostasis was achieved.  The wound edges in the  upper or cephalad three/fourths of the incision were closed with interrupted  0 Biosyn in the subcutaneous tissue and 3-0 nylon on the skin.  The portion  closest to the rectum was left open and closed loosely with 3-0 Biosyn.  Dressing was placed.  The patient tolerated the procedure well. She was  transferred to a bed and taken to the postanesthetic care unit for further  monitoring.                                               Dirk Dress. Katrinka Blazing, M.D.    LCS/MEDQ  D:  09/04/2003  T:  09/04/2003  Job:   [id number]

## 2011-05-11 NOTE — Consult Note (Signed)
[facility]  Patient:    Barbara Cummings, Barbara Cummings Visit Number: [id number] MRN: [id number]          Service Type: MED Location: 3A A304 01 Attending Physician:  Elliot Cousin Dictated by:   Maudry Diego, M.D. Proc. Date: 01/12/02 Admit Date:  01/06/2002                            Consultation Report  REASON FOR CONSULTATION:  Renal insufficiency.  HISTORY OF PRESENT ILLNESS:  Barbara Cummings is a 77 year old African-American with past medical history of type 2 diabetes and hypertension presently admitted because of swelling, cellulitis, and blistering of her legs.  Barbara Cummings was told she has hyperglycemia possibly about six years ago, and she was started on medication.  Since then, the patient was not taking any medication.  The patient also states that she had hypertension for which she was given medication, but she was lost to followup after her primary physician left. Hence, according to her, the patient did not see any physician in the last five years.  The last time, she was seen in the emergency room because of swelling and blisters of her legs.  In the emergency room, she was found to have an elevated creatinine of 3.1.  The patient denies any previous history of renal insufficiency, kidney stone, or infection.  She denies any history of renal insufficiency.  The patient denies any nausea or vomiting.  PAST MEDICAL HISTORY:  As stated above, the patient has a history of type 2 diabetes only treated for a short period and no medication for the last five to six years.  History of hypertension of the same duration and not treated. History of bullous impetigo or cellulitis, renal insufficiency of unknown duration, and anemia.  MEDICATIONS: 1. ______ 1000 mg IV q.12h. 2. Diltiazem 180 mg p.o. q.d. 3. Lasix 20 mg p.o. q.d. 4. Kay-Ciel 10 mEq p.o. q.d. 5. Hemocyte 1 tablet q.d. 6. Aspirin q.d. 7. Toprol XL 12.5 mg p.o. q.a.m.  She was stopped ______   also was discontinued.  ALLERGIES:  No known drug allergies.  SOCIAL HISTORY:  The patient denies any alcohol or drug abuse.  No history of renal insufficiency in the family.  REVIEW OF SYSTEMS:  No fevers, chills, or sweating.  The patient in general claims to feel good, no complaints.  No history of nausea, vomiting, or weight loss.  PHYSICAL EXAMINATION:  VITAL SIGNS:  Blood pressure 159/88.  GENERAL:  The patient is somewhat emaciated.  HEENT:  Muddy sclerae, nonicteric.  Oral mucosa seems to be somewhat dry.  NECK:  Supple.  No JVD, no bruit.  CHEST:  Decreased breath sounds.  No rales and no rhonchi.  HEART:  Regular rate and rhythm.  ABDOMEN:  Soft.  Positive bowel sounds.  EXTREMITIES:  Right foot has some healed scar.  Left foot some superficial looking, large, multiple ulcers without drainage.  LABORATORY DATA:  White blood cell count 4.7, hemoglobin 10.3, hematocrit 30.4. Sodium 138, potassium 5.8, glucose 118, BUN 45, creatinine 3.6, total protein 5.9, albumin 2.6.   UA from January 06, 2002, specific gravity 1.015, pH 5, protein 13 mg/dl, leukocytes esterase positive.  Her creatinine clearance which was done on January 06, 2002, was 14 cc per minute when her creatinine was 3.2.  ASSESSMENT: 1. Renal insufficiency at this moment, no previous history.  However, the    patients creatinine has been increasing since she was  admitted, and her    ultrasound of the kidney showed one of her kidneys to be 11.8 cm and the    right one to be 9.4 cm without hydronephrosis and with loss of ______    differentiation; hence, consistent with possible chronic renal    insufficiency.  At this moment, etiology for her renal insufficiency is not    clear, but in a patient who has severe hypertension, renal insufficiency    with equal kidneys, possibility of renal artery stenosis should be    considered.  Since the patient also has a history of type 2 diabetes with     proteinuria, even though the patient denies any history of diabetic    retinopathy, diabetic nephropathy should always be included in the    differential.  However, lack of significant proteinuria and lack of    diabetic retinopathy and neuropathy works against this diagnosis.  Other    possible etiology in a patient with hypertension could be hypertensive    nephrosclerosis.  At this moment, her renal insufficiency seems to be    chronic because of the renal finding and lack of improvement in her    creatinine and presence of anemia. 2. Cardiomyopathy.  Etiology not clear.  Ejection fraction around 35%,    presently being worked up by cardiology. 3. Hypertension, primary versus secondary to renal insufficiency.  Presently    her blood pressure is slightly high.  She is on diltiazem and also on    Toprol.  This could be secondary to renal insufficiency or renal artery    stenosis. 4. Anemia.  Multifactorial including renal insufficiency.  Rule out    iron-deficiency anemia. 5. Bullae and cellulitis of her leg, etiology not clear.  Apparently she is    on antibiotics and seems to be improving. 6. Type 2 diabetes.  The patient previously was on hypoglycemic agent.    Presently her diabetes is controlled with diet only. 7. Ultrasound showed some complex cysts clearly not defined in a patient    who looks emaciated.  Need to consider renal cancer or simple cyst.  RECOMMENDATIONS:  At this moment, possibly may need to discontinue Lasix as her creatinine is worsening and also we may need to do MRA to rule out renal artery stenosis.  The patient may need to be evaluated by ophthalmologist.  If the patient is found to have diabetic retinopathy, then will assume this renal insufficiency is secondary to that, and we will not do any further workup. Possibly we may consider an access for possible dialysis.  If she does not have diabetic retinopathy, this may not rule out diabetes.  Other  etiology should be considered in this circumstance.  I agree with her hypertension  medication.  If she does not have iron-deficiency anemia, we may consider starting her on Epogen.  I will follow the patient with you.  Thank you. Dictated by:   Maudry Diego, M.D. Attending Physician:  Elliot Cousin DD:  01/12/02 TD:  01/13/02 Job: [id number]

## 2011-05-11 NOTE — Op Note (Signed)
NAME:  Barbara Cummings, Barbara Cummings                ACCOUNT NO.:  [id number]   MEDICAL RECORD NO.:  [id number]          PATIENT TYPE:  AMB   LOCATION:  SDS                          FACILITY:  [facility]   PHYSICIAN:  Larina Earthly, M.D.    DATE OF BIRTH:  08/03/1927   DATE OF PROCEDURE:  05/30/2006  DATE OF DISCHARGE:  05/30/2006                                 OPERATIVE REPORT   PREOPERATIVE DIAGNOSIS:  occluded left upper arm AV fistula.   POSTOPERATIVE DIAGNOSIS:  occluded left upper arm AV fistula.   PROCEDURE:  Thrombectomy of the left upper arm AV fistula.   SURGEON:  Larina Earthly, M.D.   ASSISTANT:  Constance Holster, P.A.-C.   ANESTHESIA:  Is MAC.   COMPLICATIONS:  None.  Patient taken to recovery room stable.   PROCEDURE IN DETAIL:  The patient was taken to the operating room and placed  in prone position. The area of the left arm prepped and draped in usual  sterile fashion.  Incision was made near the antecubital space to the prior  cephalic vein to brachial artery fistula.  The vein was a very large-caliber  at this area and was opened transversely.  There was a great deal of  thrombus in the entire vein from the antecubital space all the way up to the  level of the shoulder.  This was all removed, and there was good venous  backbleeding.  On removing the Fogarty catheter, there did appear to be  stenosis in the subclavian region on removing the Fogarty catheter.  This  was flushed with heparinized saline.  Next, the arterial anastomosis was  thrombectomized with excellent inflow.  The vein was reoccluded, and the  incision in the vein was closed with a running 6-0 Prolene suture.  Clamps  removed.  Pulsatile flow was noted but did have Doppler diastolic flow as  well.  Wounds were irrigated with saline.  Hemostasis with cautery.  The  wounds were closed with 3-0 Vicryl in the subcutaneous and subcuticular  tissue.  Benzoin and Steri-Strips were applied.  The patient will be  scheduled  for a shuntogram as soon as possible, since she undoubtedly has  recurrent stenosis at a prior angioplasty site in her central cephalic vein  subclavian area.      Larina Earthly, M.D.  Electronically Signed     TFE/MEDQ  D:  05/30/2006  T:  05/31/2006  Job:  [id number]

## 2011-05-11 NOTE — Cardiovascular Report (Signed)
[place]. [facility]  Patient:    Barbara Cummings, Barbara Cummings Visit Number: [id number] MRN: [id number]          Service Type: MED Location: [phone] Attending Physician:  Daisey Must Dictated by:   Everardo Beals Juanda Chance, M.D. [facility] Proc. Date: 01/19/02 Admit Date:  01/15/2002   CC:         Dietrich Pates, M.D. [facility]  Elliot Cousin, M.D.  Cardiopulmonary Laboratory   Cardiac Catheterization  PROCEDURES PERFORMED: Cardiac catheterization.  CLINICAL HISTORY: The patient is 77 years old and was admitted to [facility] with pemphigus and cellulitis, and was found to have renal insufficiency and positive troponins on admission. She had an echocardiogram which showed an ejection fraction of 35-40% and she had a Cardiolite scan which showed inferior and apical ischemia. She had been previously scheduled for cardiac catheterization. Because she had not been given Mucomyst and fluids, this was postponed until today. She was hydrated with fluids and Mucomyst was given preprocedure and our goal was to minimize contrast at the time of the procedure. Her baseline creatinine was 3.2.  DESCRIPTION OF PROCEDURE: The procedure was performed via the right femoral artery using an arterial sheath and 6 French preformed coronary catheters.  A front wall arterial puncture was performed and Omnipaque contrast was used. We took four pictures of the left coronary artery and one picture of the right coronary artery and did not do left ventriculography. We did pass the pigtail to the left ventricle to measure pressures. A total of 40 cc of contrast were used. The patient tolerated the procedure well and left the laboratory in satisfactory condition.  RESULTS: Aortic pressure was 182/79 with a mean of 115. Left ventricular pressure was 182/19.  The left main coronary artery: The left main coronary artery had a 40% narrowing in its distal portion.  Left anterior descending: The left  anterior descending artery was moderately heavily calcified and had a 90% and 80% stenosis proximally, a 90% mid section and a 50% distal stenosis. The LAD gave rise to two small diagonal branches and a septal perforator in the midportion.  Circumflex artery: The circumflex artery gave rise to a marginal branch and two posterolateral branches. There was 50% narrowing in the proximal circumflex artery just after the marginal branch. There was 80% narrowing in one of the sub-branches of the marginal branch and there was 80% distal narrowing in the other sub-branch.  Right coronary artery: The right coronary is a dominant vessel that supplied two posterior descending and two posterolateral branches. There was 80% proximal stenosis and 70% stenosis in the distal vessel. There were 70% stenosis in the posterior descending branch in its distal portion, and there were 70% stenosis in the first posterolateral branch in its distal portion in the second posterolateral branch in its proximal portion.  No left ventriculogram was performed.  CONCLUSIONS: Severe three-vessel coronary artery disease with 40% narrowing of the distal left main coronary artery, 90% and 80% proximal and 90% mid section of the left anterior descending artery, 50% proximal stenosis in the circumflex artery with 80% stenosis in a sub-branch of the marginal vessel, 80% proximal and 70% distal stenosis in the right coronary artery.  RECOMMENDATIONS: The patient has severe three-vessel coronary artery disease. I think she needs revascularization and I think we will have to accept the fact that she will need to go on dialysis sooner rather than later if she is agreeable to this approach. We will plan to obtain  surgical consultation. Her operative risks will be increased due to her age, diabetes, renal insufficiency, and left ventricular dysfunction. If the surgeons feel her operative risk is too high, we may consider  intervention on her LAD. This also would likely result in a need for dialysis. Dictated by:   Everardo Beals Juanda Chance, M.D. [facility] Attending Physician:  Daisey Must DD:  01/19/02 TD:  01/19/02 Job: [id number]

## 2011-05-11 NOTE — Discharge Summary (Signed)
NAME:  [name], Barbara Cummings                            ACCOUNT NO.:  [id number]   MEDICAL RECORD NO.:  [id number]                   PATIENT TYPE:  INP   LOCATION:  A208                                 FACILITY:  [facility]   PHYSICIAN:  Dirk Dress. Katrinka Blazing, M.D.                DATE OF BIRTH:  08/03/1927   DATE OF ADMISSION:  09/03/2003  DATE OF DISCHARGE:  09/06/2003                                 DISCHARGE SUMMARY   DISCHARGE DIAGNOSES:  Pilonidal abscess with perirectal extension.   SPECIAL PROCEDURE:  Wide excision of pilonidal abscess with  marsupialization.   DISPOSITION:  The patient was discharged home in stable and satisfactory  condition.   DISCHARGE MEDICATIONS:  1. Hydralazine 50 mg q.8h.  2. Clonidine 0.2 mg b.i.d.  3. Actos 30 mg every day.  4. Tiazac 420 mg every day.  5. Zocor 20 mg q.h.s.  6. PhosLo 667 mg, two tabs t.i.d.  7. Sodium bicarbonate 650 mg b.i.d.  8. Keflex 500 mg q.i.d.  9. Darvocet-N one q.4h. p.r.n.  10.      Hemocyte Plus one every day.   The patient is scheduled to be seen in the office on 22 September 2003.  She  will have followup of her blood sugars and will have wound care by the  [facility] Service.   SUMMARY:  A 77 year old female admitted for treatment of an extensive  pilonidal abscess.  She gave a history of having increased drainage and  swelling from her buttocks for about one week.  Because of increasing pain,  she was seen in the emergency room where she was noted to have a large  abscess which started in the gluteal crease and extended down to the  posterior perirectal space.  She had extensive inflammation and induration.  She was admitted for IV antibiotics and wide excision and drainage of the  area.   Other medical problems included:  1. Non-insulin-dependent diabetes mellitus.  2. Hypertension.  3. Chronic renal insufficiency.  4. Normocytic anemia.  5. Congestive heart failure.  6. Chronic obstructive pulmonary  disease.  7. She also had diffuse coronary artery disease with ischemic cardiomyopathy     with global hypokinesis and a left ventricular ejection fraction of 40%.  8. She is status post four-vessel coronary artery bypass as outlined in the     admission note.   The patient was admitted and started on IV antibiotics.  The following  morning, a wide excision of the area with marsupialization was carried out.  It appeared to be a major pilonidal abscess with perirectal extension.  She  was treated with antibiotics in the postoperative period.  She did not have  any problems with hypertension or diabetes.  She did not have any chest  pain.  She appeared to be stable and after three days, it was felt that she  was stable enough to  be discharged home and she will have home health  nursing service follow her up as an outpatient.        ___________________________________________                                         Dirk Dress. Katrinka Blazing, M.D.   LCS/MEDQ  D:  09/19/2003  T:  09/19/2003  Job:  [id number]

## 2011-05-11 NOTE — Consult Note (Signed)
[place]. [facility]  Patient:    Barbara Cummings, Barbara Cummings Visit Number: [id number] MRN: [id number]          Service Type: MED Location: [phone] Attending Physician:  Daisey Must Dictated by:   Alleen Borne, M.D. Proc. Date: 01/19/02 Admit Date:  01/15/2002   CC:         Bruce R. Juanda Chance, M.D. [facility]                          Consultation Report  REFERRING PHYSICIAN:  Everardo Beals. Juanda Chance, M.D.  REASON FOR CONSULTATION:  Severe 3-vessel coronary artery disease with congestive heart failure and left ventricular dysfunction.  HISTORY OF PRESENT ILLNESS:  This patient is a 77 year old woman with a history of noninsulin-dependent diabetes, hypertension, and chronic renal insufficiency who was admitted to [facility] on January 06, 2002, due to marked bilateral lower extremity edema to the knees with bilateral lower leg blisters and ulceration with cellulitis. She was treated with intravenous antibiotics and diuresis with improvement. An echocardiogram was obtained which showed left ventricular ejection fraction was reduced to 35%-40% with global hyperkinesis. There was trace aortic insufficiency and mild mitral regurgitation and trace tricuspid regurgitation. On questioning, the patient denied any chest pain or pressure. She denies shortness of breath although her daughter said that occasionally she appears short of breath. She has had no pain in her neck or jaw and no pain in her arms. She denies PND and orthopnea. She has had no palpitations. She was transferred to [facility] and underwent cardiac catheterization today. This showed severe diffuse 3-vessel coronary artery disease. There was about 40% distal left main stenosis. The LAD was calcified proximally. There was 90% proximal and mid stenosis in the LAD. The left circumflex gave off a large bifurcating 1st marginal and had 80% and 90% stenoses in the 2 subbranches. The continuation of the  left circumflex had about 50% stenosis but was small distally. The right coronary artery had 80% proximal and 70% mid disease. There was a posterior descending artery that had 70% mid stenosis and a 1st posterolateral that had 70% mid stenosis. The 2nd posterolateral had 70% stenosis. Left ventriculogram was not performed due to elevated creatinine.  PAST MEDICAL HISTORY:  Significant for type 2 diabetes mellitus for approximately 5 or 6 years initially treated with oral hypoglycemic agent. This was discontinued several years ago by her primary care doctor. She has a history of hypertension but has been noncompliant. She has a history of renal insufficiency diagnosed in January 2003 where her creatinine was noted to be 3.4 with a BUN of 27. She has history of recently diagnosed normocytic anemia.  MEDICATIONS:  Prior to admission were Lasix 20 mg q.d., potassium chloride 10 meq q.d., and clonidine 0.1 mg b.i.d. all which were started in early January.  REVIEW OF SYSTEMS:  CONSTITUTIONAL:  She denies fevers or chills. She has had generalized fatigue. Her daughter reports some weight loss over the past few months, and she has not been eating well and says she is not hungry. EYES: She reports occasional blurred vision. ENT:  Negative. ENDOCRINE:  She has type 2 diabetes mellitus. She denies hyperthyroidism. CARDIOVASCULAR:  As above in the history of present illness. PULMONARY:  She denies cough or sputum production and denies shortness of breath, although her daughter has noticed some shortness of breath. GI:  She denies nausea or vomiting. She has had no melena  or bright red blood per rectum. GU:  She denies dysuria and hematuria. MUSCULOSKELETAL:  She denies arthralgias and myalgias. PSYCHIATRIC:  Negative. HEMATOLOGIC:  She denies a history of easy bleeding or bleeding disorders. ALLERGIES:  None. SKIN:  She has bilateral lower extremity ulcers which began about a week prior to  admission. She has never had this before.  SOCIAL HISTORY:  She is single; has 1 adult daughter here with her today. She lives with her grandson in [place]. She is retired from work in [employer]. She smokes 1 or 2 cigarettes a day and has never drank alcohol.  FAMILY HISTORY:  Her mother died when she was 4 years ago of unknown causes. Father died of unknown causes.  PHYSICAL EXAMINATION:  VITAL SIGNS:  Blood pressure is 140/70 and her pulse is 55 and regular. Respiratory rate is 16 and regular.  GENERAL:  She is a frail-appearing, elderly woman in no acute distress.  HEENT:  Normocephalic, atraumatic. Pupils are equal, round, and reactive to light and accommodation. Extraocular movements are intact. Throat is clear.  NECK:  Normal carotid bruits bilaterally. There are no bruits. There is no adenopathy or thyromegaly.  CARDIOVASCULAR:  Regular rate and rhythm. There is no murmur, rub or gallop.  LUNGS:  Clear.  ABDOMEN:  Active bowel sounds. Abdomen is soft, flat, and nontender; no palpable masses or organomegaly.  EXTREMITIES:  Shows no peripheral edema. There are dry healing blisters on both lower legs from the knee down. There is no drainage. Pedal pulses are not palpable.  NEUROLOGIC:  Alert and oriented x3. Motor and sensory examinations are grossly normal.  LABORATORY AND ACCESSORY DATA:  CHEST X-RAY:  Dated January 13, 2002, shows left lower lobe atelectasis or air space disease with bilateral pleural effusions and mild cardiomegaly and COPD.  White blood cell count on January 12, 2002, is 4.7, hemoglobin 10.3, hematocrit 30.4, platelet count of 193,000. Coagulation profile is within normal limits. Electrolytes are normal with BUN of 50 and a creatinine of 3.6. Albumin is low at 2.4. Liver function profile is normal. CPK isoenzymes were negative on admission.  EKG:  Shows normal sinus rhythm with 1st degree AV block and left anterior fascicular block. She has  left ventricular hypertrophy. 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   IMPRESSION:  This patient has severe 3-vessel coronary disease with moderate left ventricular dysfunction and congestive heart failure symptoms on presentation. I agree that coronary artery bypass graft surgery is probably the best treatment to revascularize her heart. I think surgery would be high risk for her due to her chronic renal insufficiency, diabetes, advanced age, and frail nutritionally depleted state. I think there is a good chance she would have progressive renal failure and may end up on dialysis permanently. I discussed the operative procedure and possible complications including bleeding, blood transfusion, infection, stroke, myocardial infarction, renal failure, and death. Her and her daughter understand these and would like to think about surgery further. If she did decide to pursue this surgery, I would like to give her several days to make sure that her creatinine is stable before proceeding. Dictated by:   Alleen Borne, M.D. Attending Physician:  Daisey Must DD:  01/19/02 TD:  01/19/02 Job: [id number]

## 2011-05-11 NOTE — Op Note (Signed)
NAME:  Barbara Cummings, Barbara Cummings NO.:  [id number]   MEDICAL RECORD NO.:  [id number]          PATIENT TYPE:  OIB   LOCATION:  2887                         FACILITY:  [facility]   PHYSICIAN:  Alford Highland. Rankin, M.D.   DATE OF BIRTH:  08/03/1927   DATE OF PROCEDURE:  11/27/2004  DATE OF DISCHARGE:  11/27/2004                                 OPERATIVE REPORT   PREOPERATIVE DIAGNOSES:  1.  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  2.  Retained posterior ocular implant - oil right eye.   POSTOPERATIVE DIAGNOSES:  1.  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  2.  Retained posterior ocular implant - oil right eye.   PROCEDURES:  1.  Posterior vitrectomy with endolaser photocoagulation, panretinal      photocoagulation for retinopexy and ablation purposes.  2.  Removal of posterior implant - silicone oil right eye.   SURGEON:  Alford Highland. Rankin, M.D.   ANESTHESIA:  Local with appropriate monitored anesthesia control right eye.   INDICATIONS FOR PROCEDURE:  The patient is a 77 year old woman who has  silicone oil and who awaits her final best visual functioning after silicone  oil and maintained retinal reattachment state after complex retinal  detachment repair performed of the right eye.  The patient understands the  risks of anesthesia including the reoccurrence of death, but also to the eye  including, but not limited to, from the condition and its surgical  management, hemorrhage, infection, scarring, need for another surgery, no  change in vision, loss of vision and progression of disease despite  intervention.  After appropriate signed consent was obtained, she was taken  to the operating room.  In the operating room appropriate monitors, followed  by mild sedation, 0.75% Marcaine delivered 5 mL retrobulbar followed by  additional 5 mL of  ____________, and fashioned modified [name].  Right  periocular region sterilely prepped and draped in the usual ophthalmic  fashion.  Lid speculum applied.  Conjunctival peritomy was then fashioned in  each of the quadrants with the exception of inferonasal.  The infusion  cannula was placed in the inferotemporal quadrant and verified visually.  Superior sclerotomy was fashioned with microscope placement.  Biome  attachment was used.  Oil was extracted using the oil extraction technique.  Endolaser photocoagulation was then placed to further ablate peripheral and  posterior retinopathy.  The posterior pole remained attached. The operative  nerve remained profuse.  Superior sclerotomies were then closed after fluid-  air exchange had been done twice to remove all small particles of silicone.  After removal of the instruments and  placement of fluid in the eye, superior sclerotomy was closed.  The infusion  removed.  Incision closed with 7-0 Vicryl.  Conjunctiva closed with 7-0  Vicryl.  Subconjunctival injection of antibiotic applied.  The patient  tolerated the procedure without complications.       GAR/MEDQ  D:  11/27/2004  T:  11/28/2004  Job:  [id number]

## 2011-05-11 NOTE — Discharge Summary (Signed)
NAME:  Barbara Cummings, Barbara Cummings                ACCOUNT NO.:  [id number]   MEDICAL RECORD NO.:  [id number]          PATIENT TYPE:  INP   LOCATION:  A217                          FACILITY:  [facility]   PHYSICIAN:  Lonia Blood, M.D.      DATE OF BIRTH:  08/03/1927   DATE OF ADMISSION:  08/12/2006  DATE OF DISCHARGE:  08/23/2007LH                                 DISCHARGE SUMMARY   DISCHARGE DIAGNOSIS:  1. Generalized weakness secondary to sepsis.  2. Metabolic acidosis secondary to sepsis.  3. End stage renal disease on hemodialysis Monday, Wednesday, and Friday.  4. Occult blood positive stools for outpatient capsule endoscopy.  5. Hypertension.  6. Diabetes type 2 non-insulin dependent.  7. Hypokalemia.  8. History of coronary artery bypass grafting.  9. Microscopic anemia with an MCV 103.   DISCHARGE MEDICATIONS:  1. Catapres TTS 0.3 mg each week.  2. Simvastatin 40 mg p.o. daily.  3. Hydralazine 50 mg t.i.d.  4. Renagel 800 mg t.i.d.  5. Actos 30 mg daily.  6. Tiazac 420 mg daily.  7. The patient is also to take Levaquin 700 mg daily for the next five      days.   DISPOSITION:  The patient is currently stable.  She will be discharged home.  She will have a follow up with gastroenterology and will have an outpatient  capsule endoscopy done.  Currently, her hemoglobin is stable.   PROCEDURES PERFORMED:  1. Chest x-ray on August 12, 2006, showed cardiomegaly without congestive      heart failure or atelectasis.  2. Abdominal ultrasound on August 13, 2006, showed mild to moderately      thickened gallbladder wall without evidence for cholelithiasis and      without focal tenderness to palpation with the ultrasound transducer.      Normal size common bile duct.  Somewhat prominent pancreatic head which      appears, also, somewhat hypoechoic.  Changes consistent with chronic      renal failure with bilateral multiple renal cysts and renal      calcification, bilateral small pleural effusions.   A follow up of      pancreatic head hyperechoic CT scan is recommended down the road.   CONSULTATIONS:  1. Gastroenterology, Barbara Cummings, M.D.  2. Barbara Cummings, M.D., nephrology   BRIEF HISTORY AND PHYSICAL:  Please refer to the dictated history and  physical by Dr. Carylon Cummings.  In short, however, the patient is a 77 year old  African American female who came to the emergency room with generalized  weakness.  The patient had end stage renal disease and had had dialysis  recently.  The patient has had no appetite for a few days.  She was found to  be very acidotic with an elevated white count.  Her vital signs were stable  except for pulse of 104 and respiratory rate of 22.  She had dry mucous  membranes.  White count was 19.9 with a hemoglobin of 10.5.  The patient was  acidotic with pH 6.96, pCO2 72.6, pO2 110, bicarb 3.8.  Saturation  93% on 2  liters.  Her potassium was 5.3.  BUN 17, creatinine 9.5.  Chest x-ray  reveals no acute infiltrate.  There was, however, flat atelectasis in the  right base.  The EKG showed mild sinus tachycardia with some left axis  deviation and some ST-T wave changes.  She was subsequently admitted for  further management.   HOSPITAL COURSE:  1. Sepsis.  The patient was presumed to have sepsis based on the fact that      she has evidence of infection.  She was started on IV vancomycin and      also Rocephin.  She responded very well.  Cultures were taken from both      urine and blood.  While the patient continued to be monitored in the      hospital, she did not have anymore fevers in the hospital.  Her blood      cultures were both negative x2.  Her urine culture also showed no      growth.  As such, we have discontinued the vancomycin, however, she has      been empirically treated with Levaquin which she will take for another      five days.   1. End stage renal disease.  The patient received dialysis while in the      hospital as per her  schedule.  She is currently stable.   1. Guaiac positive stool.  The patient's stool for occult blood was      positive.  Her ferritin, however, was 1500.  Her iron studies were also      unremarkable with total iron of 148 with fasting of 68% indicating that      she did not have any iron deficiency anemia.  Her B12 level was only      214.  Homocystine level was high at 19.6, however, acid is still      pending to see if this may reflect B12 deficiency especially since it      is below 300.  At this point, the patient has received 2 units of      packed red blood cells after her hemoglobin dropped down to 8.6.  She      has also had a GI consult.  She is scheduled to have capsule endoscopy      as an outpatient.   1. Diabetes.  The patient's CBGs have done well on treatment in the      hospital and will resume her home medications at discharge.   1. Hypertension.  Her blood pressure was also fully controlled during this      hospitalization with no requirement for further adjustment.   1. Hypokalemia.  This was transient and the patient's potassium was      repleted.  Otherwise, the patient has been stable and is OK for      discharge at this point.      Lonia Blood, M.D.  Electronically Signed     LG/MEDQ  D:  08/15/2006  T:  08/15/2006  Job:  [id number]   cc:   Dirk Dress. Katrinka Blazing, M.D.  Fax: [phone]   Barbara Cummings, M.D.  Fax: [phone]

## 2011-05-11 NOTE — Op Note (Signed)
NAME:  Barbara Cummings, Barbara Cummings                ACCOUNT NO.:  [id number]   MEDICAL RECORD NO.:  [id number]          PATIENT TYPE:  AMB   LOCATION:  DAY                           FACILITY:  [facility]   PHYSICIAN:  R. Roetta Sessions, M.D. DATE OF BIRTH:  08/03/1927   DATE OF PROCEDURE:  08/30/2006  DATE OF DISCHARGE:                                 OPERATIVE REPORT   SMALL BOWEL CAPSULE REPORT:   INDICATIONS FOR PROCEDURE:  The patient is a 77 year old lady with end-stage  renal disease, on dialysis, who was found to be anemic and Hemoccult  positive.  History of iron deficiency anemia.  In 2005, she underwent an EGD  and colonoscopy, which revealed colonic diverticulosis and a small sigmoid  polyp.  The EGD reveals erosive antral gastritis.  Positive Helicobacter  pylori serologies were treated with Prevpac.   Small bowel capsule study is being performed.  First gastric image was  acquired at 1 minute 4 seconds.  The patient was noted to have quite a bit  of bile-stained gastric mucosa.  There were multiple gastric erosions.  There was no ulcer or infiltrating process seen.   The first duodenal image was acquired at 1 hour 50 minutes 28 seconds.  The  patient had multiple lymphangiectasias throughout the small bowel.  One of  them appeared to be superimposed on a small polyp.   The first ileocecal valve image was acquired at 4 hours 58 minutes 54  seconds.  First cecal image was acquired at 4 hours 59 minutes 9 seconds.  The patient was noted to have what appeared to be prominent telangiectasias  in the proximal duodenum that were quite focal.   ASSESSMENT:  Gastric erosions, small bowel telangiectasias, one small  jejunal polyp.  Question focal telangiectasias in the proximal duodenum may  be contributing factor to the patient's anemia.   RECOMMENDATIONS:  Recommend EGD to further evaluate.  I will arrange.      Jonathon Bellows, M.D.  Electronically Signed     RMR/MEDQ  D:  08/30/2006   T:  08/30/2006  Job:  [id number]   cc:   Kingsley Callander. Ouida Sills, MD  Fax: [phone]   [facility] Hospitalist Team

## 2011-05-11 NOTE — H&P (Signed)
[facility]  Patient:    Barbara Cummings, Barbara Cummings Visit Number: [id number] MRN: [id number]          Service Type: MED Location: 3A A304 01 Attending Physician:  Elliot Cousin Dictated by:   Elliot Cousin, M.D. Admit Date:  01/06/2002                           History and Physical  DATE OF BIRTH:  [birth year]  CHIEF COMPLAINT:  Bilateral leg swelling and infection/blistering.  HISTORY OF PRESENT ILLNESS:  Barbara Cummings is a 77 year old African-American woman who presented to the office today in follow-up and consultation with Jake Shark A. Tanda Rockers, M.D., for lower extremity blistering and cellulitis.  The patient was initially seen in the emergency room on January 01, 2002, for a one-week history of swelling in both legs with diffuse blistering and ulcerations on both lower legs.  Treatment was started with Keflex by Nicoletta Dress. Colon Branch, M.D., emergency room physician.  The patient was advised to follow up in the office the following day.  The patient presented to the office on January 02, 2002, for reestablishment of medical care and follow-up of the swelling and ulcerations on her legs.  The patient has not been seen in the office in over five years.  When the patient was examined, she had approximately 3+ pitting edema bilaterally with diffuse bullae on both legs some of which had apparently ruptured, causing diffuse stage I ulcerations. There was quite a bit of serous seeping from the bullae and from the legs. However, there was no purulent drainage.  There was mild diffuse erythema. The patient was not febrile nor did she have an elevated white blood cell count per the labs that were drawn the day before in the emergency room. Therefore, it was felt that the patient would do fairly well with outpatient management on oral antibiotics and wound care by home health nursing.  The patient was advised to continue Keflex.  In addition, she was given a prescription for Levaquin  for double antibiotic coverage.  However, over the past three to four days, the patient was unable to fill the prescription secondary to a lack of money, which was not apparent at the time the prescription was written.  The patient again was evaluated by Jake Shark A. Tanda Rockers, M.D., today.  He advised inpatient treatment of the leg ulcerations and cellulitis.  The patient will therefore be admitted today with consultation from [facility] A. Tanda Rockers, M.D., for treatment of bilateral lower extremity edema/superficial ulcerations/cellulitis.  REVIEW OF SYSTEMS:  Positive for generalized fatigue, occasional blurred vision, nocturia, rash and swelling of her legs, and occasional numbness in her lower extremities.  Her review of systems is negative for fever, chills, weight loss, headache, double vision, nasal congestion, earache, ringing in her ears, chest pain, shortness of breath, cough, heartburn, nausea, vomiting, diarrhea, constipation, blood in her stools, black, tarry stools, painful urination, joint pain, anxiety, depression, and allergy symptoms.  PAST MEDICAL HISTORY: 1. Type 2 diabetes mellitus diagnosed approximately five to six years ago.    a. The patient was initially treated with an oral hypoglycemic agent, but       it was discontinued several years ago by her former primary care       physician. 2. Hypertension.  (The patient has been noncompliant with follow-up and    treatment.) 3. Normocytic anemia per laboratories drawn on January 01, 2002, in the    emergency room. 4.  Renal insufficiency per laboratories drawn on January 01, 2002, in the    emergency room with a creatinine of 3.4 and a BUN of 27.  CURRENT MEDICATIONS: 1. Lasix 20 mg q.d. started on January 02, 2002. 2. Potassium chloride 10 mEq q.d. started on January 02, 2002. 3. Clonidine 0.1 mg b.i.d. started on January 01, 2002.  FAMILY HISTORY:  The patients mother died when the patient was 77 years old. She has no known  history of her mothers death.  Her father is also deceased. She does not know the reason or the cause of his death.  The patient was raised by her grandmother.  SOCIAL HISTORY:  The patient is single.  She has one adult daughter.  She [place] her [place]  She completed the 12th grade.  She is retired from working in a Doctor, general practice.  She smokes approximately one to two cigarettes a day.  She has never drank any alcohol. She denies street drug use.  ALLERGIES:  No known drug allergies.  PHYSICAL EXAMINATION:  The temperature was 97.3 degrees, pulse 111, respiratory rate 22, and blood pressure 176/91.  GENERAL APPEARANCE:  The patient is an alert, 77 year old, African-American woman, who is currently sitting up in bed in no acute distress.  HEENT:  The head is normocephalic and atraumatic.  The pupils equal, round, and reactive to light.  The extraocular movements are intact.  The oropharynx reveals moist mucous membranes.  She does have dentures.  No posterior exudates or erythema.  Her nasal mucosa is moist.  The external canals of both ears are patent.  NECK:  Supple.  No adenopathy.  No thyromegaly.  LUNGS:  Clear to auscultation bilaterally.  Breathing is nonlabored.  HEART:  S1 and S2 with no murmurs, rubs, or gallops.  Pedal pulses are difficult to palpate secondary to edema.  BREASTS:  Both breasts are soft with no masses palpated.  No axillary adenopathy.  ABDOMEN:  Positive bowel sounds.  Soft, nontender, and nondistended.  No hepatosplenomegaly.  No masses palpated.  RECTAL:  A small external nonbleeding hemorrhoid present.  Good rectal tone. A scant amount of stool in the rectal vault.  The stool is brown and guaiac negative.  MUSCULOSKELETAL:  The patient has mild diffuse muscle atrophy which appears to be appropriate for age.  The range of motion of the joints is within normal limits.  No tenderness of any joints.  No joint  effusion or erythema.  EXTREMITIES/SKIN:  There is approximately 1-2+ bilateral pretibial edema which  is somewhat reduced from 3-4+ pitting edema last week.  There are diffuse ulcerations on both pretibial areas of the legs extending down to the dorsum of both feet, greater on the left lower extremity.  There are multiple large bullae of the left lower extremity with one or two bullae on the right lower extremity.  There are diffuse stage I-II ulcerations on both pretibial areas. There is surrounding erythema, but with no obvious purulent drainage.  The patient has diffuse fungal changes of toes on both feet bilaterally.  She does have quite a bit of hypertrophy and discoloration of toes on both feet.  PSYCHIATRIC:  The patient is alert and oriented x 3.  She is quite cooperative.  She is aware of her surroundings and totally intact with sensorium.  NEUROLOGIC:  The patients cranial nerves II-XII are intact.  Her gait is within normal limits.  Her strength is 5/5 throughout.  She does have good  sensation at the plantar surfaces of both feet to soft touch.  ADMISSION LABORATORY DATA:  Sodium 143, potassium 3.8, chloride 113, CO2 20, glucose 127, BUN 27, creatinine 3.4, calcium 8.7.  WBC 4.6, hemoglobin 11.1, hematocrit 32.4, MCV 89.9, platelets 178.  ASSESSMENT: 1. Bilateral lower extremity edema with diffuse stage I-II ulcerations and    surrounding cellulitis. 2. Hypertension.  The patient has been noncompliant over the past five to six    years with antihypertensive therapy.  Her hypertension is not controlled    very well.  Clonidine 0.1 mg b.i.d. was started by the emergency room    physician on January 01, 2002. 3. Type 2 diabetes mellitus.  The patient gives a history of diabetes now    controlled with diet alone. 4. Renal insufficiency.  The patient probably has chronic renal insufficiency    rather than acute renal insufficiency secondary to her chronic uncontrolled     hypertension and history of diabetes mellitus. 5. Normocytic anemia.  PLAN:  1. Admit the patient for inpatient treatment of wound care and a consultation     with Jake Shark A. Tanda Rockers, M.D.  2. Start empiric antibiotics with Ancef 1 g q.12h. IV.  Obtain wound cultures     per Jake Shark A. Tanda Rockers, M.D.  3. Wound care with whirlpool and antiseptic soap daily.  4. Collect a 24-hour urine for creatinine clearance and protein.  Obtain an     ultrasound of the kidneys for evaluation secondary to chronic renal     insufficiency.  5. Obtain a 2-D echocardiogram of the heart secondary to lower extremity     edema for evaluation of left ventricular function.  6. Obtain a baseline chest x-ray, PA and lateral.  7. Evaluate the patients iron status by obtaining vitamin B12, RBC folate,     total iron, and ferritin levels in the morning.  8. Evaluate the patients tyroid function by obtaining a TSH and a free T4.  9. Obtain a hemoglobin A1C and fasting lipid panel in the morning. 10. Continue antihypertensive therapy with clonidine 0.1 mg b.i.d. 11. Add diltiazem at 180 mg q.d. Dictated by:   Elliot Cousin, M.D. Attending Physician:  Elliot Cousin DD:  01/06/02 TD:  01/06/02 Job: [id number]

## 2011-05-11 NOTE — Consult Note (Signed)
NAME:  Barbara Cummings, Barbara Cummings                ACCOUNT NO.:  [id number]   MEDICAL RECORD NO.:  [id number]          PATIENT TYPE:  INP   LOCATION:  IC10                          FACILITY:  [facility]   PHYSICIAN:  Kassie Mends, M.D.      DATE OF BIRTH:  08/03/1927   DATE OF CONSULTATION:  08/13/2006  DATE OF DISCHARGE:                                   CONSULTATION   REQUESTING PHYSICIAN:  [facility].   REASON FOR CONSULTATION:  Anemia.   HISTORY OF PRESENT ILLNESS:  The patient is a 77 year old African-American  female with end-stage renal dialysis on hemodialysis who presented yesterday  with complaints of generalized weakness and several day history of loss of  appetite. She said she is feeling well last night. She did have one day  where she had a couple of loose stools, but this resolved. She generally has  two to three stools a day. Denies any melena or rectal bleeding. She had  hemodialysis on Friday. Afterwards, she started feeling significantly  weaker. Over the weekend, symptoms progressed. She eventually came to the ER  early Monday morning. She denies abdominal pain, nausea or vomiting. She  says her appetite has been poor the last two or three days. She has had some  right shoulder pain but otherwise no abdominal pain. She is noted to have  significant metabolic acidosis and leukocytosis. It was felt she had sepsis  although source of infection has not been identified. We have been consulted  today because of acute on chronic anemia.   MEDICATIONS AT HOME:  1. Catapres TTS #2 patch weekly.  2. Simvastatin 40 mg daily.  3. Hydralazine 50 mg t.i.d.  4. Tiazac 420 mg daily.  5. Renagel 100 mg t.i.d.  6. Actos 30 mg daily.   ALLERGIES:  No known drug allergies.   PAST MEDICAL HISTORY:  1. End-stage renal disease on hemodialysis.  2. Diabetes mellitus.  3. Hypertension.  4. Hyperlipidemia.  5. Coronary artery disease status post CABG in 2003.  6. History of chronic anemia and  has received Epogen for several years.  7. We saw her in 2005 for iron-deficiency anemia and heme-positive stools.      At that time, colonoscopy revealed pan colonic diverticulosis. A small      polyp was removed from the sigmoid colon. EGD revealed erosive antral      gastritis, positive Helicobacter pylori serology status post Prevpac      therapy according to the family.  8. She has had multiple eye surgeries bilaterally. The last one was on the      right eye in 2005 for retinal detachment.  9. In 2004, she had surgery for pilonidal fistula.   FAMILY HISTORY:  Negative for colorectal cancer, chronic GI illnesses.   SOCIAL HISTORY:  She is single. She has one daughter. She is retired from  [employer]. She quit smoking somewhere between 5 and 10 years ago. No alcohol  use.   REVIEW OF SYSTEMS:  GASTROINTESTINAL:  See HPI for GI. CONSTITUTIONAL:  No  weight loss. CARDIOPULMONARY:  No chest  pain or shortness of breath.   PHYSICAL EXAMINATION:  VITAL SIGNS:  Temperature 97.7, pulse 92,  respirations 14, blood pressure 114/46, weight 57.5 kg.  GENERAL:  Pleasant, alert and oriented elderly female in no acute distress.  She is accompanied by her daughter. SKIN:  Warm and dry. No jaundice. HEENT:  Sclerae are nonicteric. Oropharyngeal moist and pink.  CHEST:  Lungs are clear to auscultation. CARDIAC:  Regular rate and rhythm,  3/6 systolic ejection murmur. No rubs or gallops. ABDOMEN:  Positive bowel  sounds. Soft, nontender, nondistended. No organomegaly or masses. No rebound  tenderness or guarding. No abdominal bruits or hernias. LUNGS:  Clear to  auscultation bilaterally. EXTREMITIES:  No edema.   LABORATORY DATA:  White count initially was 19,900, 7200 today; hemoglobin  initially was 10.4 with MCV of 103.4; today, her hemoglobin is 8.5, MCV  99.1; platelets 182,000. Sodium 137, potassium 4.1, BUN 51, creatinine 5.6,  glucose 110, total bilirubin 0.9, alkaline phosphatase 92, AST  43, ALT 19,  albumin 3.1, lipase 68. Hemoglobin A1c 7.7. Blood cultures x2 are pending.   IMPRESSION:  The patient is a 77 year old lady with end-stage renal disease  on hemodialysis, chronic anemia for which she has received Epogen in the  past who presents with generalized weakness. She is noted to have  leukocytosis and metabolic acidosis. Sepsis is suspected. She also has acute  on chronic anemia for which we have been consulted. Initially, her MCV was  103.4. She does have a history of occult GI bleed in 2005 as outlined above.  Current hemoccult status is unknown. Suspect her anemia is multifactorial.  We need to check B12 and folate given her elevated MCV. We will also recheck  iron and ferritin. Hemoccults are pending. Recommend transfusion as needed.  Agree with Protonix for now. It is noted that Lovenox has been stopped.      Tana Coast, P.A.      Kassie Mends, M.D.  Electronically Signed   LL/MEDQ  D:  08/13/2006  T:  08/13/2006  Job:  [id number]   cc:   Hanley Hays. Josefine Class, M.D.  Fax: [phone]   Jorja Loa, M.D.  Fax: [phone]

## 2011-05-11 NOTE — H&P (Signed)
NAME:  Barbara Cummings, Barbara Cummings                            ACCOUNT NO.:  [id number]   MEDICAL RECORD NO.:  [id number]                   PATIENT TYPE:  INP   LOCATION:  A208                                 FACILITY:  [facility]   PHYSICIAN:  Dirk Dress. Katrinka Blazing, M.D.                DATE OF BIRTH:  08/03/1927   DATE OF ADMISSION:  09/03/2003  DATE OF DISCHARGE:                                HISTORY & PHYSICAL   HISTORY OF PRESENT ILLNESS:  [age]-year-old female admitted for  treatment of a perirectal and possible extensive pilonidal abscess.  The  patient gives a history of having drainage and swelling from her buttock for  about one week.  She states that she had a boil on her left buttock.  It  started draining on the morning of admission.  Because of increasing pain,  the patient was seen in the emergency room, where she was noted to have a  large abscess which started in the gluteal crease and extended down to the  posterior rectal space.  She has extensive inflammation and induration.  She  is admitted and will have IV antibiotics and will have wide excision and  drainage of the area.   PAST HISTORY:  She has non-insulin-dependent diabetes mellitus,  hypertension, chronic renal insufficiency, normocytic anemia, prior history  of congestive heart failure and chronic obstructive lung disease.  The  patient also has significant diffuse coronary artery disease with ischemic  cardiomyopathy with global hypokinesis and a left ventricular ejection  fraction of about 40%.  She is status post four-vessel coronary artery  bypass graft with the left internal mammary to the left anterior descending,  a saphenous vein graft to the posterior descending branch of the right  coronary, a sequential saphenous vein graft to the first and second obtuse  marginals of the left circumflex.  She has been asymptomatic since this  surgery in 2003.  I cannot determine whether or not she has had a recent  stress test.   Review of her nuclear medicine studies does not show any  stress test since her surgery.  Other medical problems include bullous  pemphigus which was present in January of 2003, which has resolved.  Other  surgical procedure is a left brachiocephalic A-V shunt which was placed on  13 July 2002 in preparation for hemodialysis.   PRESENT MEDICATIONS:  1. Glipizide 10 mg daily.  2. Clonidine 0.2 mg b.i.d.  3. Actos 30 mg daily.  4. Tiazac 420 mg daily.  5. Zocor 20 mg daily.  6. PhosLo 667 mg two tabs t.i.d.  7. Hydralazine 50 mg t.i.d.  8. Sodium bicarbonate 650 mg b.i.d.   PHYSICAL EXAMINATION:  GENERAL:  On examination, the patient appears to be  in no acute distress.  VITAL SIGNS:  Blood pressure was 180/80, pulse of 110, respirations 20,  temperature 97.2.  HEENT:  Unremarkable.  She is  edentulous.  NECK:  Neck is supple.  No JVD or bruit.  No adenopathy or thyromegaly.  CHEST:  Chest clear to auscultation.  No rales, rubs, rhonchi or wheezes.  HEART:  Regular rhythm.  No ectopy or gallop.  No murmur heard.  ABDOMEN:  Abdomen soft, nontender.  No masses.  Normal active bowel sounds.  EXTREMITIES:  Well-healed surgical scar of left leg from saphenous vein  harvest.  She has superficial changes of bullous pemphigus which represent  scarring from her acute episode in 2003.  She has weak pulses in her  dorsalis pedis and posterior tibials bilaterally.  NEUROLOGIC:  Exam reveals no focal motor, sensory or cerebellar deficit.   IMPRESSION:  1. Suspect a major pilonidal abscess with perirectal extension, but this may     also be a perirectal abscess with presacral extension.  2. Coronary artery disease, clinically stable since coronary artery bypass,     but evidence of left ventricular ejection fraction of 40% with global     hypokinesis and severe diffuse four-vessel disease.  3. Diabetes mellitus, non-insulin-dependent.  4. Hypertension.  5. Chronic renal insufficiency.  6.  Normocytic anemia.  7. Chronic obstructive lung disease.   PLAN:  The patient will be admitted and started on antibiotics.  She will  have wide excision under spinal anesthesia.                                               Dirk Dress. Katrinka Blazing, M.D.    LCS/MEDQ  D:  09/03/2003  T:  09/04/2003  Job:  [id number]

## 2011-05-11 NOTE — Op Note (Signed)
[place]  Patient:    Barbara Cummings, Barbara Cummings Visit Number: [id number] MRN: [id number]          Service Type: MED Location: [id number] Attending Physician:  Cleatrice Burke Dictated by:   Alleen Borne, M.D. Proc. Date: 01/22/02 Admit Date:  01/15/2002   CC:         Bruce R. Juanda Chance, M.D. [facility]   Operative Report  PREOPERATIVE DIAGNOSIS:  Severe three vessel coronary artery disease with moderate left ventricular dysfunction and congestive heart failure.  POSTOPERATIVE DIAGNOSIS:  Severe three vessel coronary artery disease with moderate left ventricular dysfunction and congestive heart failure.  OPERATIVE PROCEDURE:  Median sternotomy, extracorporeal circulation, coronary artery bypass graft surgery x 4 using a left internal mammary artery graft to the left anterior descending coronary artery, with a saphenous vein graft to the posterior descending branch of the right coronary artery, and a sequential saphenous vein graft to the first and second obtuse marginal branch of the left circumflex coronary artery, and a vein harvest from the right leg.  SURGEON:  Alleen Borne, M.D.  FIRST ASSISTANT:  Salvatore Decent. Cornelius Moras, M.D. and Areta Haber, P.A.-C.  ANESTHESIA:  General endotracheal.  CLINICAL HISTORY:  This patient is a 77 year old woman with a history of non-insulin-dependent diabetes, hypertension, and chronic renal insufficiency, who was admitted to [facility] on January 06, 2002, due to marked bilateral lower extremity edema to the knees with bilateral lower leg blisters and ulcerations with cellulitis.  She was treated with antibiotics and diuresis with improvement.  An echocardiogram showed left ventricular ejection fraction of 35-40% with global hypokinesis.  There was trace aortic insufficiency and mild mitral regurgitation and a trace of tricuspid regurgitation.  The patient denied any chest  pain or pressure.  She was transferred to [facility] and underwent cardiac catheterization which showed severe diffuse three vessel coronary artery disease.  There was about 40% distal left main stenosis.  The LAD was calcified proximally.  There was 90% proximal and mid LAD stenosis.  The left circumflex gave off a large bifurcating first marginal, and had 80% and 90% stenoses in the two subbranches.  The continuation of the left circumflex had about 50% stenosis, but was small.  The right coronary artery had 80% proximal and 70% mid stenosis.  There was a posterior descending artery that had a 70% mid stenosis.  The first posterolateral had 70% mid stenosis and the second posterolateral had 70% stenosis.  These were both small vessels.  Left ventricular function was not evaluated with a ventriculogram due to elevated creatinine.  After review of the angiogram and examination of the patient, it was felt that coronary artery bypass graft surgery was probably the best treatment to prevent further ischemia and infarction.  It was felt by me that this would be a higher risk procedure due to the 77 patients age, diabetes, chronic renal insufficiency, and nutritional depletion.  I discussed these concerns with the patient and her family including the high risk of developing permanent renal failure requiring dialysis for the rest of her life.  They understood and agreed to proceed.  I discussed the operative procedure with them including alternatives, benefits, and risks including bleeding, blood transfusion, infection, stroke, myocardial infarction, and death.  They understood these risks and agreed to proceed.  DESCRIPTION OF PROCEDURE:  The patient was taken to the operating room and placed on the table in the supine position.  After induction of general endotracheal anesthesia, a Foley catheter was placed in the bladder using sterile technique.  Then, the chest, abdomen, and both lower  extremities were prepped and draped in the usual sterile manner.  The chest was entered through a median sternotomy incision and the pericardium opened in the midline. Examination of the heart showed fairly good ventricular function.  The patient had cardiomegaly.  The ascending aorta had no palpable plaques in it.  Then, the left internal mammary artery was harvested from the chest wall as a pedicle graft.  This was a medium caliber vessel with excellent blood flow through it.  At the same time, a segment of greater saphenous vein was harvested from the right thigh endoscopically.  The vein was of medium to large caliber and good quality.  Then, the patient was heparinized, and when an adequate accurate declotting time was achieved, the distal ascending aorta was cannulated using a 20-French aortic cannula for arterial inflow.  Venous outflow was achieved using a two-stage venous cannula through the right atrial appendage.  An antegrade cardioplegia and vent cannula was inserted into the aortic root.  The patient was placed on cardiopulmonary bypass and the distal coronary was identified.  She had severe diffuse coronary disease with plaque extending out the entire length of the arteries out to the apex of the heart.  It was difficult to find areas that were soft enough to ________ bypass.  Then, the aorta was cross-clamped and 500 cc of cold blood antegrade cardioplegia was administered in the aortic root with quick arrest of the heart.  Systemic hypothermia at 20 degrees centigrade and topical hypothermia with ice saline was used.  A temperature probe was placed on the septum and an insufflating pad in the pericardium.  The first distal anastomosis was then performed to the first obtuse marginal branch.  The internal diameter was 1.6 mm.  The conduit used was a segment of greater saphenous vein.  The anastomosis was performed in a sequential side-to-side manner using continuous 7-0  Prolene suture.  Flow was measured  through the graft and was excellent.  The second distal anastomosis was performed the second marginal branch.  The internal diameter was 1.6 mm.  The conduit used was the same segment of greater saphenous vein with anastomosis performed in a sequential end-to-side manner using continuous 7-0 Prolene suture.  Flow was noticed in the graft and was excellent.  Then, another dose of cardioplegia was given down the vein grafts and in the aortic root.  The distal left circumflex was small and diffusely diseased and not graftable.  Then, the third distal anastomosis was performed to the posterior descending coronary artery.  This vessel was soft enough to graft proximally, which I felt would supply the distal right coronary artery and the posterolateral branches, but beyond the proximal portion of the posterior descending artery, it was very diseased and hard, and not suitable for bypass.  The internal diameter was about 1.6 mm.  The conduit used was a second segment of greater saphenous vein and the anastomosis performed in an end-to-side manner using continuous 7-0 Prolene suture.  Flow was measured through the graft and was excellent.  Then, the fourth distal anastomosis was performed to the mid to distal portion of the left anterior descending coronary artery.  The internal diameter was about 1.75 mm.  The conduit used was the left internal mammary graft and this was brought through an opening in the left pericardium anterior to the phrenic  nerve.  It was anastomosed to the LAD in an end-to-side manner using continuous 8-0 Prolene suture.  The pedicle was tacked to the epicardium with 6-0 Prolene sutures, and this bypass was essentially sewn into an area that was soft enough for bypassing, but still has plaque present.  The patient was rewarmed to 37 degrees centigrade.  The clamp was removed from the mammary and pedicle.  There was rapid warming of  the ventricular septum and return of spontaneous ventricular fibrillation.  The cross-clamp was removed with a time of 57 minutes and the patient spontaneously converted to sinus rhythm.  Then, a partial occlusion clamp was placed on the aortic root and the two proximal vein graft anastomoses were performed in an end-to-side manner using continuous 6-0 Prolene suture.  The clamp was removed, the vein grafts deaired, and the clamps removed from them.  The proximal and distal anastomoses appeared hemostatic and aligned the graft satisfactory.  Graft markers were placed around the proximal anastomosis.  Two temporary right ventricular and right atrial pacing wires were placed and brought out through the skin.  When the patient had rewarmed to 37 degrees centigrade, she was weaned from cardiopulmonary bypass on no inotropic agents.  Total bypass time was 101 minutes.  Cardiac function appeared good with a cardiac output of 4-5 L per minute.  Protamine was given, and the venous and aortic cannulas were removed without difficulty.  Hemostasis was achieved.  Three chest tubes were placed with a tube in the posterior pericardium, one in the left pleural space, and one in the anterior mediastinum.  The pericardium was reapproximated over the heart.  The sternum was closed with #6 stainless steel wires.  The fascia was closed with continuous #1 Vicryl suture.  The subcutaneous tissue was closed with continuous 2-0 Vicryl and the skin with 3-0 Vicryl subcuticular closure.  The lower extremity vein harvest incisions were closed in a similar manner.  The sponge, needle, and instrument counts were correct according to the scrub nurse.  Dry sterile dressings were applied around the incisions and around the chest tube which were hooked to Pleuravac suction.  The patient remained hemodynamically stable and was transported to the SICU in guarded, but stable condition. Dictated by:   Alleen Borne,  M.D. Attending Physician:  Cleatrice Burke DD:  01/22/02 TD:  01/23/02 Job: [id number]

## 2011-05-11 NOTE — Consult Note (Signed)
NAME:  Barbara Cummings, GRANBERRY                ACCOUNT NO.:  [id number]   MEDICAL RECORD NO.:  [id number]           PATIENT TYPE:   LOCATION:                                 FACILITY:   PHYSICIAN:  R. Roetta Sessions, M.D. DATE OF BIRTH:  08/03/1927   DATE OF CONSULTATION:  11/09/2004  DATE OF DISCHARGE:                                   CONSULTATION   REFERRING PHYSICIAN:  Dr. Kristian Covey.   REASON FOR CONSULTATION:  Progressive anemia, Hemoccult positive stool.   HISTORY OF PRESENT ILLNESS:  The patient is a 77 year old black female who  presents today for further evaluation of above stated findings.  She sees  Dr. Kristian Covey for chronic renal failure.  She has been receiving weekly  Epogen injections for many months.  She has been requiring increased dose  over the last couple of months.  Currently she is on 25,000 units subcu once  a week.  She recently had several Hemoccult cards which were positive.  Her  hemoglobin has been running around the low 8 range in September and October.  Her last hemoglobin was 7.9, hematocrit 27.5 on November 06, 2004.  BUN was  51, creatinine 0.5.  Iron is 75, TIBC 411, iron saturation is 18%, ferritin  9.  She denies any abdominal pain, diarrhea, melena, rectal bleeding,  nausea, vomiting or heartburn.  She has had constipation since starting iron  therapy.  She has never had a colonoscopy.   CURRENT MEDICATIONS:  1.  Ferrous sulfate 150 mg b.i.d.  2.  Catapres 0.3 mg patch q. weekly.  3.  Zocor 40 mg q.o.d.  4.  Hydralazine 50 mg t.i.d.  5.  Aspirin 81 mg q.d.  6.  Actos 20 mg q.d.  7.  Sodium bicarbonate 650 mg t.i.d.  8.  Tiazac 420 mg q.d.  9.  Humalog mix 75/25 20 units q.h.s.  10. Sodium polystyrene sulfonate suspension 15/60 q. Sunday.   ALLERGIES:  No known drug allergies.   PAST MEDICAL HISTORY:  1.  Diabetes mellitus.  2.  Chronic renal failure.  3.  Hypercholesterolemia.  4.  Mixed iron deficiency anemia and anemia of chronic disease on  Epogen as      outlined above.  5.  Coronary artery disease, status post coronary artery bypass grafting in      February of 2003.  6.  Hypertension.   FAMILY HISTORY:  Negative for colorectal cancer or chronic GI illnesses.   SOCIAL HISTORY:  She has a daughter.  She is single.  She is retired from  [employer].  She quit smoking about three years ago.  Denies any alcohol use.   REVIEW OF SYSTEMS:  See HPI for GI.  GENERAL:  Denies any weight loss.  She  has a good appetite.  Denies fatigue or orthostatic symptoms.  CARDIOPULMONARY:  Denies any chest pain or shortness of breath.   PHYSICAL EXAMINATION:  VITAL SIGNS:  Weight 146; height 5 feet 7 inches;  temperature 97.4; blood pressure 142/70; pulse 80.  GENERAL:  Pleasant, well-nourished, well-developed, black female in no acute  distress.  SKIN:  Warm and dry; no jaundice.  HEENT:  Pupils equal, round and reactive to light.  Conjunctivae are pale.  Sclerae nonicteric.  Oropharyngeal mucosa moist and pink.  No lesions,  erythema or exudate.  No lymphadenopathy, thyromegaly.  CHEST:  Lungs are clear to auscultation.  CARDIAC:  Regular rate and rhythm; 3/6 systolic ejection murmur, heard best  in the upper precordium.  ABDOMEN:  Positive bowel sounds.  Soft, nontender, nondistended.  No  organomegaly or masses.  EXTREMITIES:  No edema.   IMPRESSION:  Barbara Cummings is a pleasant, 77 year old lady who has a history of  chronic renal failure along with chronic anemia, which appears to be mixed  iron deficiency anemia and anemia of chronic disease.  She has been on  Epogen for quite some time and is requiring an increased dose over the last  several weeks to months.  She is also on oral iron supplements.  She is  requiring more Epogen and iron in order to maintain her hemoglobin.  She  recently collected Hemoccult cards which were positive.  She has never had a  colonoscopy and needs to have one for further evaluation at this time.  I   discussed with her today if colonoscopy is negative we will proceed with  upper endoscopy as well.   PLAN:  1.  Colonoscopy +/- EGD in the near future.  2.  She will need antibiotics given heart murmur.  3.  We will have her hold her iron for seven days and aspirin for three      days.  4.  She has been given instructions regarding management of her diabetes for      the day of prep.  5.  Further recommendations to follow.     ____________________  R. Roetta Sessions, M.D.     ____________________  Tana Coast, P.A.     Lesl   LL/MEDQ  D:  11/09/2004  T:  11/09/2004  Job:  [id number]   cc:   Jorja Loa, M.D.  [address]  [place]  [place] [address]  Fax: [phone]

## 2011-05-11 NOTE — Op Note (Signed)
NAME:  Barbara Cummings, Barbara Cummings                ACCOUNT NO.:  [id number]   MEDICAL RECORD NO.:  [id number]          PATIENT TYPE:  AMB   LOCATION:  DAY                           FACILITY:  [facility]   PHYSICIAN:  Lionel December, M.D.    DATE OF BIRTH:  08/03/1927   DATE OF PROCEDURE:  11/20/2004  DATE OF DISCHARGE:                                 OPERATIVE REPORT   PROCEDURES:  Total colonoscopy, followed by esophagogastroduodenoscopy.   INDICATIONS:  Barbara Cummings is a 77 year old African-American female with a  history of progressive anemia, who was noted to have heme-positive stools.  She, however, does not have any GI symptoms.  She has been on low-dose ASA.  She has had iron studies.  Her serum iron was reportedly 75, TIBC 411, iron  saturation 18%, and ferritin was low at 9.  She is undergoing colonoscopy,  to be followed by EGD unless colon, if necessary.  The procedure risks were  reviewed with the patient and informed consent was obtained.  She has a  history of heart murmur and was given SBE prophylaxis.   PREMEDICATION:  Cetacaine spray for pharyngeal topical anesthesia, Demerol  50 mg IV, Versed 5 mg IV in divided dose.   In the preop area she was also given her usual dose of hydralazine 50 mg and  placed on a Catapres TTS 0.3 mg, which she had not done.   FINDINGS:  Procedures performed in endoscopy suite.  The patient's vital  signs and O2 saturation were monitored during procedure and remained stable.  Her blood pressure came down to within normal limits with antihypertensives.   PROCEDURE #1:  Colonoscopy.   A rectal examination performed.  No abnormality noted on external or digital  exam.  The Olympus video scope was placed in the rectum and advanced under  vision into sigmoid colon and beyond.  Preparation was satisfactory.  She  had scattered diverticula throughout the colon.  The scope was passed to the  cecum, which was identified by ileocecal valve and appendiceal orifice.  Pictures taken for the record.  As the scope was withdrawn, the colonic  mucosa was carefully examined.  There was a single small polyp at proximal  to midsigmoid colon, which was initially biopsied but I felt that it needed  to be snared and it was snared.  The mucosa of the rest of the sigmoid colon  was normal other than diverticula.  The rectal mucosa similarly was normal.  The scope was retroflexed to examine anorectal junction, which is  unremarkable.  The endoscope was straightened and withdrawn.  The patient  prepared for procedure #2.   PROCEDURE #2:  Esophagogastroduodenoscopy.   The patient was placed in the left lateral recumbent position.  The Olympus  video scope was passed via oropharynx without any difficulty into esophagus.   Esophagus:  Mucosa of the esophagus was normal.  Squamocolumnar junction was  at 40 cm from the incisors.  No hernia was noted.   Stomach:  It was empty and distended very well with insufflation.  Folds of  the proximal stomach were  normal.  Examination of the mucosa revealed few  antral erosions with a speck of blood, but there was no active bleeding.  No  ulcer crater was found.  The pyloric channel was patent.  Angularis, fundus,  and cardia were examined by retroflexing the scope and were normal.   Duodenum:  The bulbar mucosa was normal.  The scope was passed to the second  part of the duodenum, where mucosa and folds were normal.  The endoscope was  withdrawn.  The patient tolerated the procedure well.   FINAL DIAGNOSES:  1.  Pancolonic diverticulosis.  2.  Small polyp snared from sigmoid colon.  3.  No evidence of angiodysplasia.  4.  Erosive antral gastritis, which may or may not be the source of her      occult gastrointestinal bleed.   PLAN:  1.  A high-fiber diet.  2.  Citrucel or Metamucil one tablespoonful daily.  3.  CBC will be checked along with H. pylori serology.  4.  The patient advised to resume her ASA 81 mg after 10  days.  5.  I will be contacting with the patient with the results of biopsy and      blood test and further recommendations, if any.     Barbara Cummings   NR/MEDQ  D:  11/20/2004  T:  11/20/2004  Job:  [id number]   cc:   Jorja Loa, M.D.  [address]  [place]  [place] [address]  Fax: [phone]   Annia Friendly. Loleta Chance, MD  [address]  [place]  [place] [address]  Fax: [phone]

## 2011-05-11 NOTE — Op Note (Signed)
NAME:  Barbara Cummings, Barbara Cummings                ACCOUNT NO.:  [id number]   MEDICAL RECORD NO.:  [id number]          PATIENT TYPE:  AMB   LOCATION:  SDS                          FACILITY:  [facility]   PHYSICIAN:  Larina Earthly, M.D.    DATE OF BIRTH:  08/03/1927   DATE OF PROCEDURE:  02/07/2007  DATE OF DISCHARGE:                               OPERATIVE REPORT   PREOPERATIVE DIAGNOSIS:  End stage renal disease with occluded left  upper arm arteriovenous fistula.   POSTOPERATIVE DIAGNOSIS:  End stage renal disease with occluded left  upper arm arteriovenous fistula.   PROCEDURE:  Thrombectomy of left upper arm arteriovenous fistula.   SURGEON:  Larina Earthly, M.D.   ASSISTANT:  Stephanie Acre. Dominick, P.A.-C.   ANESTHESIA:  MAC.   COMPLICATIONS:  None.   DISPOSITION:  To recovery room stable.   PROCEDURE IN DETAIL:  The patient was taken to the operating and placed  in the supine position where the area of the left arm was prepped and  draped in the usual sterile fashion. Using local anesthesia, an incision  was made over the prior AV anastomosis.  The patient had some moderate  aneurysmal degeneration of her fistula.  The vein was isolated and  encircled with a vessel loop.  The fistula was opened transversely and  the arterial anastomosis was thrombectomized and was widely patent and  was occluded with a vascular clamp. Using a #4 Fogarty catheter, the  cephalic vein throughout the arm was thrombectomized.  There did appear  to be some stenosis at the level of the shoulder possibly at the entry  into the subclavian vein.  The remaining portion of the vein throughout  the upper arm was without any evidence of narrowing.  The vein was  flushed with heparinized saline and reoccluded.  The incision in the  vein was closed with a running 6-0 Prolene suture.  The clamps were  removed and a thrill was noted.  This was more pulsatile than should be  encountered most likely due to the proximal  stenosis.  The wounds were  irrigated with saline.  Hemostasis was with electrocautery.  The wounds  were closed with 3-0 Vicryl in the subcutaneous and subcuticular tissue.  Benzoin and Steri-Strips were applied.  The patient was taken to the  recovery room in stable condition.  She will be scheduled for an  outpatient shuntogram to determine if angioplasty is warranted.      Larina Earthly, M.D.  Electronically Signed     TFE/MEDQ  D:  02/07/2007  T:  02/07/2007  Job:  [id number]

## 2011-05-11 NOTE — Discharge Summary (Signed)
[facility]  Patient:    ANTWANETTE, WESCHE Visit Number: [id number] MRN: [id number]          Service Type: MED Location: [phone] Attending Physician:  Daisey Must Dictated by:   Elliot Cousin, M.D. Admit Date:  01/15/2002 Disc. Date: 01/15/02   CC:         Maudry Diego, M.D.  [place] Bing, M.D. [facility])   Discharge Summary  DATE OF BIRTH:  August 03, 1927  DISCHARGE DIAGNOSES:  1. Bilateral lower extremity bullous pemphigus with surrounding cellulitis     and edema.  2. Cardiolite-Adenosine stress test with evidence for inferoseptal,     inferoapical, and anteroseptal ischemia, and infarction.  3. Cardiomyopathy with:     a. Ejection fraction per Adenosine-Cardiolite 25%.     b. A 2-D echocardiogram revealed a thickened inferior vena cava, diffuse        left ventricular hypokinesis with an ejection fraction of 35-40%.  4. Small bilateral pleural effusions per chest x-ray.  5. Chronic obstructive pulmonary disease per chest x-ray.  6. Hypertension.  7. Chronic renal insufficiency:     a. A 24-hour urine revealed a creatinine clearance of 14 liters and a        24-hour urine protein of 434 milligrams.  8. Mild complicated renal cysts bilaterally per ultrasound.  9. Chronic normocytic anemia. 10. Remote history of type 2 diabetes mellitus.  MEDICATIONS AT DISCHARGE/TRANSFER: 1. Levaquin 250 mg q.d. x3 days. 2. Hemocyte one q.d. 3. Baby aspirin 81 mg q.d. 4. Toprol XL 12.5 mg q.d. 5. Cardizem CD 240 mg q.d. 6. Mucomyst 600 mg b.i.d. q.d. until heart catheterization.  DISCHARGE DISPOSITION:  The patient was transferred to [facility] on January 15, 2002, in the care of [facility].  A cardiac catheterization is planned for the date of January 15, 2002.  The patient will be discharged to home from [facility].  She was advised to follow up with Dr. Sherrie Mustache after hospital discharge in one  week.  CONSULTATIONS: 1. Mills Koller, M.D. 2. Dietrich Pates, M.D. 3. Maudry Diego, M.D.  PROCEDURES PERFORMED: 1. Renal ultrasound.  The right kidney measured 9.4 cm in length.  The left    kidney measured 11.8 cm in length.  Cortical echogenicity increased in both    kidneys compatible with medical renal disease changes.  Mildly complicated    cyst seen within both kidneys, measuring up to 2 cm in size. 2. A 2-D echocardiogram.  The results revealed the intraventricular septum and    posterior wall are moderately thickened, trace aortic insufficiency, trace    mitral valve insufficiency, left ventricular function within normal limits,    no pericardial effusion, overall left ventricular function is depressed    with ejection fraction 35-40% with diffuse hypokinesis, worse at the apex. 3. Adenosine-Cardiolite stress test.  The results revealed substantial left    ventricular dilatation, multiple segmental wall motion abnormalities with    severely impaired overall left ventricular systolic function with evidence    for inferoseptal, inferoapical, and anteroseptal ischemia and infarction.    Ejection fraction estimated at 25%.  HISTORY OF PRESENT ILLNESS:  The patient is a 77 year old African American woman who presented to the office on January 03, 2002, for emergency room follow up of lower extremity blistering, cellulitis, and edema.  The patient failed outpatient treatment with antibiotics and diuretics.  The patient was therefore admitted to the hospital for further medical care and treatment and management  of the lower extremity bullae and cellulitis.  ADMISSION LABORATORY DATA:  Sodium 143, potassium 3.8, chloride 113, CO2 20, glucose 127, BUN 27, creatinine 3.4.  Calcium 8.7.  WBC 4.6, hemoglobin 11.1, hematocrit 32.4, MCV 89.9, platelets 178.  Chest x-ray - the results revealed cardiomegaly, mild elongation of the aorta, small left pleural effusion, underlying COPD and  bronchitic changes, a very small right pleural effusion, degenerative changes of the spine.  Admission EKG - normal sinus rhythm, left anterior fascicular block, and diffuse T-wave abnormalities, question of Q-waves in the anterior leads.  HOSPITAL COURSE:  #1 - BILATERAL LOWER EXTREMITY BULLOUS PEMPHIGUS WITH SURROUNDING CELLULITIS AND EDEMA.  The initial management included empiric antibiotic coverage with Ancef at 1 g IV q.12h., obtaining vascular surgeon consult with Dr. Mills Koller, wound care per Dr. Tanda Rockers, and low dose diuretic for the bilateral leg edema.  Per Dr. Tanda Rockers assessment and recommendations, the patient was treated with whirlpool therapy and antiseptic soap daily with daily dressing changes.  Dr. Tanda Rockers obtained cultures of the wound sites.  The patient had very little discomfort with the evidence of large bullae on her lower extremities with the left being greater than the right.  She did have some discomfort after the whirlpool therapy.  She was treated with as needed analgesics.  The daily whirlpool therapy and dressing changes proved to be very effective.  The bullae ruptured and the result was diffuse stage I to stage II ulcerations on both pretibial areas of her legs with the worse ulcerations being present on the left lower extremity.  The ulcerations eventually dried out with no surrounding erythema nor purulent drainage.  Her legs which were 3-4+ pitting edema on admission revealed absolutely no edema at hospital discharge.  Treatment will continue with dry gauze wrap alternating with Vaseline impregnated gauze to be wrapped around the ulcerated lesions.  Incidentally, the wound culture grew out Enterobacter cloacae which was resistant to Ancef.  The patient did receive five days of Ancef therapy. However, it was discontinued on hospital day #5, and the remainder of the treatment will continue with Levaquin 250 mg q.d. for an additional three to  five  days.  The bacteria was sensitive to Ciprofloxacin.  #2 - CARDIOMYOPATHY/BILATERAL PLEURAL EFFUSIONS/POSSIBLE CARDIAC ISCHEMIA. The evaluation of the patients 3-4+ pitting edema led to obtaining a 2-D echocardiogram of her heart as well as obtaining cardiac enzymes to rule out myocardial ischemia.  The cardiac enzymes was not impressive with regards to the CK-MB.  However, the troponin I was elevated.  The results of the cardiac enzymes is as follows; CK 84 to 65 to 61, CK-MB 2.0 to 1.6 to 1.4, and the troponin 0.90 to 0.71.  The 2-D echocardiogram that was obtained revealed an overall depressed left ventricular function with an estimated ejection fraction at 35-40% with diffuse hypokinesis, worse at the apex as read by cardiologist, Dr. Tenny Craw.  The initial chest x-ray also revealed cardiomegaly with bilateral small pleural effusions.  The EKG revealed normal sinus rhythm with a left anterior fascicular block and diffuse T-wave abnormalities as well as question Q-waves in the anterior leads.  The patient had no complaints of chest pain during the entire hospital course, nor she did have any complaints of shortness of breath.  Nevertheless, she was treated with Lasix 20 mg IV q.d. with supplementation with potassium as well as a baby aspirin 81 mg q.d. She was also started on Toprol 12.5 mg q.d. given the EKG and 2-D echocardiogram findings  and for better blood pressure control.  An ACE inhibitor was not entertained secondary to the patients chronic renal insufficiency.  Cardiology was consulted.  Subsequently, an Adenosine-Cardiolite stress test was performed.  The results were impressive for a severely impaired overall left ventricular systolic function and evidence for ischemia in the inferoseptal, inferoapical, and anteroseptal areas of her heart.  It was felt therefore that the patient needed a cardiac catheterization for further evaluation and management.  Given that the patient had  chronic renal insufficiency with a creatinine in the 3.0 to the 3.6 range, there was some concern regarding worsening renal function with the contrast that will be given to the patient for the catheterization.  Dr. Kristian Covey, the nephrologist explained the potential for the need for dialysis, hopefully short-term if the patients renal function would worsen after the cardiac catheterization.  The patient understood the potential risks and benefits of the cardiac catheterization and agreed to having the study performed.  The patient was therefore rehydrated with normal saline at 50 cc/hr and the Lasix was temporarily discontinued prior to hospital discharge.  The patient was therefore transferred to [facility] in the care of [facility] for further workup of the probable cardiac ischemia and cardiomyopathy.  #3 - CHRONIC RENAL INSUFFICIENCY.  The patients creatinine ranged from 2.2 to 3.6, and the BUN ranged from 35 to 53 during the hospital course.  The patient revealed no history of chronic renal disease.  However, she had a remote history of type 2 diabetes mellitus and has a long history of untreated hypertension.  For evaluation, a 24-hour urine was obtained to evaluate the creatinine clearance and proteinuria.  In addition, a renal ultrasound was ordered for evaluation.  The creatinine clearance was substantially low at 14 L and the 24-hour urine protein was 434 mg.  The renal ultrasound revealed findings consistent with medical renal disease changes.  The ultrasound was also significant for mildly complicated cysts in both kidneys.  The etiology of the cysts is unknown.  There was no hydronephrosis or calcifications seen in either kidney.  Nephrologist, Dr. Kristian Covey was consulted for further evaluation and management.  He ordered a 24-hour urine for immunoelectrophoresis, an MRA as an outpatient to rule out renal artery stenosis, an ANA, and compliment factors.  The  24-hour urine for immunoelectrophoresis results are still pending.  The ANA was positive.  However, the ANA titer was insignificant at 1-40.  The compliment results are as follows; compliment C3 102 (within normal limits), compliment C4 33 (within normal limits), and total compliment CH50 pending.  The etiology of the patients chronic renal insufficiency is unclear. However, it is probably secondary to hypertensive nephrosclerosis with some contribution from her history of type 2 diabetes mellitus.  It is important to note that the patient will need to have an MRA/MRI of her kidneys and renal arteries to evaluate the renal cysts, and to rule out renal artery stenosis. As noted above, the patient was mildly volume repleted with normal saline in preparation for the cardiac catheterization.  Given that the patient will receive contrast/dye for the cardiac catheterization, she is at high risks for requiring dialysis, short-term versus long-term.  She will probably need to be placed on long-term Lasix for her history of chronic renal insufficiency and history of cardiomyopathy.  She will need to be reassessed after discharge from the hospital at [facility].  #4 - HYPERTENSION.  The patients blood pressure was quite elevated in the office, ranging systolically from  190 to 200.  She was given a prescription for clonidine 0.1 mg b.i.d. by the emergency room physician a few days prior to her presentation to the office.  When the patient was admitted to the hospital, her blood pressure was 176/91.  She was therefore started on Cardizem 180 mg q.d. and the clonidine was continued at 0.1 mg b.i.d. However, after the findings of the EKG and the 2-D echocardiogram, the clonidine was discontinued and Toprol 12.5 mg q.d. was initiated.  The Cardizem was eventually titrated up to 240 mg for better blood pressure control.  The patient was transferred on Cardizem CD 240 mg q.d. and Toprol XL 12.5  mg q.d.  Her blood pressures were much better controlled on this regimen prior to hospital discharge.  Her blood pressure prior to hospital discharge is 114/64.  #5 - NORMOCYTIC ANEMIA.  The patients hemoglobin ranged from 9.6 to 10.3, and  the hematocrit ranged from 28.2 to 30.4 during the hospital course.  During the rectal exam, the stool was brown and guaiac negative.  Further evaluation of the patients anemia revealed a total iron of 32 which was low, a vitamin B12 level of 282, within normal limits, and RBC folate within normal limits at 232, and a ferritin at 60, low normal.  The patient was therefore started on iron supplementation with Hemocyte one each day.  The etiology of the patients anemia is probably multifactorial including chronic renal insufficiency and possibly gastrointestinal source.  Epogen may be needed if the anemia does not correct on iron supplementation alone.  She will probably need an evaluation via endoscopy/colonoscopy as an outpatient.  #6 - REMOTE HISTORY OF TYPE 2 DIABETES MELLITUS.  The patients hemoglobin A1C was assessed and found to be within normal limits at 6.4.  #7 - LOW TSH WITH NORMAL FREE T4.  The patients TSH was 0.244 with normal range being 0.350 to 5.50, and the patients free T4 was within normal limits at 1.02.  No further evaluation or management was required at this time given that the patient had a normal level of free T4.  The thyroid panel will need to be reassessed periodically.  #8 - FASTING LIPID PROFILE.  The patients total cholesterol was 111, HDL cholesterol 44, LDL cholesterol 57, and total triglycerides 51.  DISCHARGE LABORATORY DATA:  Sodium 140, potassium 4.6, chloride 111, CO2 21, BUN 52, creatinine 3.3, glucose 86. Dictated by:   Elliot Cousin, M.D. Attending Physician:  Daisey Must DD:  01/15/02 TD:  01/17/02 Job: [id number]

## 2011-05-11 NOTE — Consult Note (Signed)
NAME:  ROMY, IPOCK                ACCOUNT NO.:  [id number]   MEDICAL RECORD NO.:  [id number]          PATIENT TYPE:  INP   LOCATION:  IC10                          FACILITY:  [facility]   PHYSICIAN:  Jorja Loa, M.D.DATE OF BIRTH:  08/03/1927   DATE OF CONSULTATION:  08/12/2006  DATE OF DISCHARGE:                                   CONSULTATION   Ms. Monahan is a 77 years old African American who is well-known to me who  has history of diabetes and hypercholesterolemia, coronary artery disease,  end-stage renal disease maintained on chromic hemodialysis presently was  brought because of increased somnolence and change in mental status.  Ms.  Buxton states that she was feeling good until last week when she has an  episode of diarrhea for 1 day but she denies any fevers, chills or sweating.  She went home over the weekend after she had had dialysis on Friday.  She  continued to have increased somnolence.  Hence, presently she was brought to  the hospital.  When she was brought she was found to have elevated white  blood cell count with significant metabolic acidosis.  Hence, possibility of  sepsis was entertained and the patient admitted to the hospital.  Presently  Ms. Robicheaux denies any fevers, chills or sweating.  She complains of some  weakness.  She does not have any nausea or vomiting and diarrhea was also  has stopped.  At this moment she denies any cough.   PAST MEDICAL HISTORY:  As stated above, the patient has history of  hypertension, history of diabetes, history of end-stage renal disease on  maintenance hemodialysis, history of hypercholesterolemia, history of  coronary artery disease status post CABG and also history of anemia  secondary to GI bleeding, diarrhea and iron deficiency and also secondary to  chronic renal failure.  Her medication at this moment she has received 1  gram of vancomycin and she has also received Rocephin 1 gram.  She is on  Catapres TTS patch once  a week.  __________ 40 mg p.o. q.d., hydralazine 50  mg p.o. t.i.d., Tiazac 420 mg p.o. q.d., Renagel she is on 800 mg __________  p.o. t.i.d., she is getting __________ . sodium bicarb at 75 mL per hour.   ALLERGIES:  No known allergy.   SOCIAL HISTORY:  No history of smoking.  No history of alcohol abuse.  She  has very supportive family.  She used to work at [employer].   REVIEW OF SYSTEMS:  No nausea, no vomiting.  Appetite is reasonable.  She  denies any cough or shortness of breath but mainly feeling sleepy and weak.  She denies any abdominal pain or chest pain.  She has diarrhea but that was  stopped about two days ago.  This only __________ 1 day.   EXAMINATION:  GENERAL:  Patient alert.  Presently seems to be __________  VITAL SIGNS:  Blood pressure while she is in emergency room when she was  seen systolic was 151, blood pressure diastolic 59.  Pulse of 110,  respiratory rate is 20, temperature  is 98.4.  HEENT:  No conjunctival pallor.  She has some dry oral mucosa.  CHEST:  Clear to auscultation.  HEART:  Exam revealed regular rate and rhythm.  No murmur.  ABDOMEN:  Soft, positive bowel sounds.  EXTREMITIES:  At this moment she does not have any edema.   Her blood work, pH was 6.98, pCO2 of 17.  This was the initial ABG and her  white blood cell count is 19.9, hemoglobin 10.5, hematocrit 72, her sodium  is 159.  Potassium is 5.3, BUN is 70, creatinine is 9.5, CO2 was 6, albumin  is 2.1 and lipase of 68, blood culture pending.   ASSESSMENT:  1. Possible sepsis.  Presently, source of infection not clear.  The      patient febrile, reasonable blood pressure but she has severe metabolic      acidosis with elevated white blood cell count of 19,000.  She does not      have a graft.  She has a fistula.  Had work on fistula about a month      ago. Hence at this moment we may need to entertain possibility of      sepsis.  The patient is covered for gram-positive and  gram-negative      coverage.  2. Severe metabolic acidosis probably related to sepsis.  She has received      1 amp sodium bicarb, she is on sodium bicarb drip.  3. Hypertension.  The patient is on hydralazine, Tiazac and also Catapres.      Her blood pressure seems to be controlled very well.  4. Anemia.  The patient has previous history of GI bleeding.  The patient      has been doing very well.  She is on Epogen, H&H seems to also      __________  5. History of diabetes.  She is not on any medication.  6. Coronary artery disease, status post CABG.  The patient __________   RECOMMENDATIONS:  Agree with antibiotics and will consider dialyzing her and  possibly will cut her IV fluids once she is started on dialysis.  Will  continue other medication.  Will follow her blood culture.  If the blood  culture shows __________ then will manage accordingly.      Jorja Loa, M.D.  Electronically Signed     BB/MEDQ  D:  08/12/2006  T:  08/12/2006  Job:  [id number]

## 2011-05-11 NOTE — Op Note (Signed)
[place]  Patient:    Barbara Cummings, Barbara Cummings Visit Number: [id number] MRN: [id number]          Service Type: DSU Location: [facility] [id number] Attending Physician:  Macario Carls Dictated by:   Theresia Majors Tanda Rockers, M.D. Proc. Date: 07/13/02 Admit Date:  07/13/2002 Discharge Date: 07/13/2002   CC:         Dr. Elinor Parkinson at Dialysis Unit in [place], [place]   Operative Report  PREOPERATIVE DIAGNOSIS:  End-stage renal disease.  POSTOPERATIVE DIAGNOSIS:  End-stage renal disease.  OPERATION:  Creation of a left brachial cephalic AV fistula.  SURGEON:  Theresia Majors. Tanda Rockers, M.D.  INDICATIONS:  The patient is a 77 year old female with advanced cardiomyopathy and end-stage renal disease manifested by increasing BUN and creatinine.  Her last creatinine clearance was 10.  We recommended that she undergo placement of an AV fistula in anticipation of the eminent dialysis.  DESCRIPTION OF PROCEDURE:  The patient was taken to the operating room and placed on the table in the supine position.  Anesthesia was achieved with an IV sedation augmented with local infiltration of 1% Xylocaine.  The left upper extremity was prepped with Betadine soap and painted with Betadine solution and sterile drapes were applied.  A sigmoid incision was made through the left antecubital fossa and taken down through the skin layers with hemorrhage controlled with the cautery.  The cephalic vein was identified and mobilized adequately to just impose the brachial artery.  The flexor retinaculum was scored in a T configuration.  The brachial artery was dissected and mobilized to allow placement of vessel loops.  The cephalic vein was divided distally and the vessel tapered, irrigated with heparinized saline, and calibrated to 3 mm.  The patient was systemically heparinized with 5000 units of heparin. Proximal and distal control were obtained in the brachial artery following equilibration.  A  longitudinal arteriotomy was performed and end-to-side anastomosis was achieved between the vein and the artery using a continuous suture technique with 6-0 Prolene.  Upon release of the inflow occlusion, there was free flow through the fistula with a palpable thrill.  This thrill extended to the anterior brachium at the level of the deltoid.  The heparin was reversed with Protamine.  The incisions were closed in layers.  A sterile compressive dressing applied.  The patient tolerated the procedure well and was transferred from the operating room to the recovery room in satisfactory condition with stable vital signs. Dictated by:   Theresia Majors Tanda Rockers, M.D. Attending Physician:  Macario Carls DD:  07/13/02 TD:  07/15/02 Job: [id number]

## 2011-05-11 NOTE — H&P (Signed)
NAME:  Barbara Cummings, Barbara Cummings                ACCOUNT NO.:  [id number]   MEDICAL RECORD NO.:  [id number]          PATIENT TYPE:  INP   LOCATION:  IC10                          FACILITY:  [facility]   PHYSICIAN:  Kingsley Callander. Ouida Sills, MD       DATE OF BIRTH:  08/03/1927   DATE OF ADMISSION:  08/12/2006  DATE OF DISCHARGE:  LH                                HISTORY & PHYSICAL   CHIEF COMPLAINT:  Weakness.   HISTORY OF PRESENT ILLNESS:  This patient is a 77 year old African-American  female who presented to the emergency room early Monday morning with  generalized weakness.  She has a history of end-stage renal disease and was  last dialyzed on Friday.  She has lost her appetite over the last few days.  She has not experienced nausea, vomiting, diarrhea, fevers, chills, chest  pain, abdominal pain or rash.  She was evaluated in the emergency room and  was found to be markedly acidotic with an elevated white count.  She  received an amp of bicarb prior to my arrival.   PAST MEDICAL HISTORY:  1. End-stage renal disease.  2. Diabetes.  3. Hypertension.  4. Hyperlipidemia.  5. Status post CABG.   MEDICATIONS:  1. Catapres TTS #2 patch weekly.  2. Simvastatin 40 mg daily.  3. Hydralazine 50 mg t.i.d.  4. Tiazac 420 mg daily.  5. Renagel 100 mg t.i.d.  6. Actos 30 mg daily.   ALLERGIES:  None.   SOCIAL HISTORY:  She lives at home with her family.  She does not smoke or  drink.   REVIEW OF SYSTEMS:  Noncontributory.   PHYSICAL EXAMINATION:  VITAL SIGNS:  98.4, blood pressure 134/56, pulse 104,  respirations 22, oxygen saturation 96%.  GENERAL:  Alert and in no distress.  HEENT:  No scleral icterus.  The pharynx is edentulous.  Mucous membranes  are moist.  NECK:  No JVD, thyromegaly or lymphadenopathy.  LUNGS:  Clear.  HEART:  Regular, tachycardic, with no murmurs.  CHEST:  There is a well-healed sternotomy wound.  ABDOMEN:  Soft, nontender.  No hepatosplenomegaly.  No CVA tenderness.  EXTREMITIES:  She has a graft in her left arm with a good thrill.  No  cyanosis, clubbing or edema.  NEUROLOGIC:  Grossly intact.  SKIN:  Dry.   LABORATORY DATA:  White count 19.9, hemoglobin 10.5, platelets 316,000 with  81 neutrophils, 12 lymphocytes.  ABG reveals a pH of 6.96, pCO2 17.6, pO2  110 with bicarb of 3.8, oxygen saturation of 93% on 2 L.  Potassium 5.3,  sodium 139, chloride 94, bicarb 6, glucose 68, BUN 70, creatinine 9.5,  calcium 10.7 albumin 3.1, SGOT 43, bilirubin 0.9.  lipase 68.  Chest x-ray  reveals no acute infiltrate.  There is slight atelectasis in the right base.  EKG reveals sinus tachycardia at 108 with left axis deviation and lateral ST-  T wave changes.   IMPRESSION:  1. Sepsis.  The patient has a marked a metabolic acidosis and a      leukocytosis.  Blood and urine cultures are being  obtained.  She is      being treated IV vancomycin and IV Rocephin initially.  Her case has      been discussed with Dr. Sherle Poe and Dr. Kristian Covey.  2. End-stage renal disease.  Consult nephrology with continued dialysis.      She is scheduled for Monday, Wednesday, Friday usually.  3. Hypertension.  Continue clonidine, hydralazine and diltiazem.  4. Diabetes.  Will follow a.c. and h.s. Accu-Cheks and treat with NovoLog.      Actos will be held for now.  5. Hyperkalemia.  6. Hypocalcemia.  7. History of coronary artery bypass grafting.  8. Macrocytic anemia.  MCV is 103.      Kingsley Callander. Ouida Sills, MD  Electronically Signed     ROF/MEDQ  D:  08/12/2006  T:  08/12/2006  Job:  [id number]

## 2011-05-11 NOTE — Consult Note (Signed)
[place]  Patient:    Barbara Cummings, Barbara Cummings Visit Number: [id number] MRN: [id number]          Service Type: MED Location: [phone] Attending Physician:  Daisey Must Dictated by:   Alleen Borne, M.D. Proc. Date: 01/19/02 Admit Date:  01/15/2002                            Consultation Report  REFERRING PHYSICIAN: Bruce R. Juanda Chance, M.D.  REASON FOR CONSULTATION: Severe three-vessel coronary artery disease with congestive heart failure and left ventricular dysfunction.  HISTORY OF PRESENT ILLNESS: This patient is a 77 year old woman, with a history of non-insulin dependent diabetes, hypertension, and chronic renal insufficiency, who was admitted to [facility] on January 06, 2002, due to marked bilateral lower extremity edema to the knees with bilateral lower leg blisters and ulceration with cellulitis. She was treated with intravenous antibiotics and diuresis with improvement. An echocardiogram was obtained, which showed left ventricular ejection fraction was reduced to 35-40% with global hypokinesis. There was trace aortic insufficiency and mild mitral regurgitation and trace tricuspid regurgitation. On questioning, the patient denied any chest pain or pressure. She denied shortness of breath, although her daughter said that occasionally she appears short of breath. She has had no pain in her neck or jaw and no pain in her arms. She denies PND and/or orthopnea. She has had no palpitations. She was transferred to [facility] and underwent cardiac catheterization today. This showed severe diffuse three-vessel coronary artery disease. There was about 40% distal left main stenosis. The LAD was calcified proximally. There was 90% proximal and mid stenosis in the LAD. The left circumflex gave off a large bifurcating first marginal and had 80% and 90% stenosis in the two sub-branches. The continuation of the left circumflex had about 50%  stenosis, but was small distally. The right coronary artery had 80% proximal and 70% mid disease. There was posterior descending artery that had 70% mid stenosis and a first posterolateral that had 70% mid stenosis. The second posterolateral had 70% stenosis. Left ventriculogram was not performed due to elevated creatinine.  PAST MEDICAL HISTORY: Significant for type 2 diabetes mellitus for approximately 5 or 6 years, initially treated with oral hypoglycemic agent. This was discontinued several years ago by her primary care doctor. She has a history of hypertension but has been noncompliant. She has a history of renal insufficiency, diagnosed in January of 2003 where her creatinine was noted to be 3.4 with a BUN of 27.  She has a history of recently diagnosed normocytic anemia.  MEDICATIONS PRIOR TO ADMISSION: 1. Lasix 20 mg q.d. 2. Potassium chloride 10 mEq q.d. 3. Clonidine 0.1 mg b.i.d., all of which were started in early January.  REVIEW OF SYSTEMS: CONSTITUTIONAL: She denies fevers or chills. She has had generalized fatigue. Her daughter reports some weight loss over the past few months and she has not been eating well and says she is not hungry. EYES: She reports occasional blurred vision. ENT: Negative. ENDOCRINE: She has type 2 diabetes mellitus. She denies hypothyroidism. CARDIOVASCULAR: As above in the history of present illness. PULMONARY: She denies cough or sputum production and denies shortness of breath, although her daughter has noticed some shortness of breath. GASTROINTESTINAL: She denies nausea or vomiting.  She has had no melena or bright red blood per rectum. GENITOURINARY: She denies dysuria and hematuria. MUSCULOSKELETAL: She denies arthralgias and myalgias. PSYCHIATRIC: Negative. HEMATOLOGIC:  She denies a history of easy bleeding or bleeding disorders. ALLERGIES: None. SKIN: She has bilateral lower extremity ulcers, which began about a week prior to admission,  and she has never had this before.  SOCIAL HISTORY: She is single and has one adult daughter here with her today. She lives with her grandson in [place]. She is retired from working in a mill. She smokes 1 or 2 cigarettes a day and has never drank alcohol.  FAMILY HISTORY: Her mother died when she was 37 years old of unknown causes. Father died of unknown causes.  PHYSICAL EXAMINATION:  VITAL SIGNS:  On physical examination, her blood pressure is 140/70 and her pulse is 65 and regular. Respiratory rate is 16 and unlabored.  GENERAL APPEARANCE: She is a frail-appearing, elderly woman in no acute distress.  HEENT: Examination shows her to be normocephalic and atraumatic. The pupils are equal and reactive to light and accommodation. Extraocular muscles are intact. Her throat is clear.  NECK: Examination shows normal carotid pulses bilaterally. There are no bruits. There is no adenopathy or thyromegaly.  CARDIAC: Examination shows a regular rate and rhythm. There is no murmur, rub or gallop.  LUNGS: Examination is clear.  ABDOMEN: Examination shows active bowel sounds. Her abdomen is soft, flat and nontender. There are no palpable masses or organomegaly.  EXTREMITIES: Examination shows no peripheral edema. There are dry healing blisters on both lower legs from the knee down. There is no drainage.  Pedal pulses are not palpable.  NEUROLOGICAL: Examination shows her to be alert and oriented x3. Motor and sensory examinations are grossly normal.  LABORATORY AND ACCESSORY DATA: Chest x-ray examination dated January 13, 2002, shows left lower lobe atelectasis, lower airspace disease with bilateral pleural effusions, and mild cardiomegaly, and COPD.  White blood cell count on January 20 is 4.7 with a hemoglobin of 10.3, hematocrit of 30.4, platelet count of 193,000. Coagulation profile is within normal limits. Electrolytes are normal with a BUN of 50 and a creatinine of 3.6.  Albumin is low at 2.4. Liver function profile is normal. CPK isoenzymes were negative on admission.   Electrocardiogram shows normal sinus rhythm with first-degree AV block and left anterior fascicular block. She has left ventricular hypertrophy. 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  IMPRESSION: This patient has severe three-vessel coronary artery disease with moderate left ventricular dysfunction and congestive heart failure symptoms on presentation. I agree that coronary artery bypass graft surgery is probably the best treatment to revascularize her heart. I think surgery would be high risk for her due to her chronic renal insufficiency, diabetes, advanced age, and frail nutrition depleted state. I think there is a good chance that she would have progressive renal failure and may end up on dialysis permanently. I discussed the operative procedure and possible complications including bleeding, blood transfusion, infection, stroke, myocardial infarction, renal failure, and death. Her and her daughter understand these and would like to think about surgery further. If she did decide to proceed with surgery, I would likely give her several days to make sure that her creatinine is stable before proceeding. Dictated by:   Alleen Borne, M.D. Attending Physician:  Daisey Must DD:  01/19/02 TD:  01/20/02 Job: [id number]

## 2011-05-11 NOTE — Procedures (Signed)
NAME:  Barbara Cummings, Barbara Cummings                ACCOUNT NO.:  [id number]   MEDICAL RECORD NO.:  [id number]          PATIENT TYPE:  INP   LOCATION:  IC10                          FACILITY:  [facility]   PHYSICIAN:  Edward L. Juanetta Gosling, M.D.DATE OF BIRTH:  08/03/1927   DATE OF PROCEDURE:  08/12/2006  DATE OF DISCHARGE:                                EKG INTERPRETATION   The rhythm is sinus tachycardia.  There is first-degree AV block.  There is  left axis deviation.  Q waves are seen which may indicate a previous septal  infarction.  There is ST-T wave abnormality which may indicate ischemia and  clinical correlation is suggested.   IMPRESSION:  Abnormal electrocardiogram.      Edward L. Juanetta Gosling, M.D.  Electronically Signed     ELH/MEDQ  D:  08/13/2006  T:  08/13/2006  Job:  [id number]

## 2011-05-11 NOTE — Op Note (Signed)
NAME:  Barbara Cummings, Barbara Cummings                            ACCOUNT NO.:  [id number]   MEDICAL RECORD NO.:  [id number]                   PATIENT TYPE:  OIB   LOCATION:  2859                                 FACILITY:  [facility]   PHYSICIAN:  Alford Highland. Rankin, M.D.                DATE OF BIRTH:  08/03/1927   DATE OF PROCEDURE:  01/25/2003  DATE OF DISCHARGE:  01/25/2003                                 OPERATIVE REPORT   PREOPERATIVE DIAGNOSES:  1. Preretinal vitreous hemorrhage, right eye.  2. 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  3. Proliferative diabetic retinopathy, right eye.   POSTOPERATIVE DIAGNOSES:  1. Preretinal vitreous hemorrhage, right eye.  2. 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  3. Proliferative diabetic retinopathy, right eye.  4. 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  5. Extensive peripheral retinal nonperfusion.   PROCEDURES:  1. Posterior vitrectomy with membrane peel, OD.  2. Endolaser panretinal photocoagulation, OD.  3. Injection of vitreous substitute, silicone oil 5000 centistokes, in order     to maintain retinal reattachment.   SURGEON:  Alford Highland. Rankin, M.D.   ANESTHESIA:  Local retrobulbar with anesthesia control.   INDICATION FOR PROCEDURE:  The patient is a 77 year old woman with profound  vision loss in the right eye, with threatened total retinal detachment in  the ocular region because of progressive tractional detachment to the right  eye and vitreous preretinal hemorrhage.  The patient has a wolf-jaw type of  macular contractive detachment.  The patient understands this is an attempt  to release the traction and preserve visual function and induce quiescence  of proliferative diabetic retinopathy.  She understands the risks of  anesthesia, including but not limited to hemorrhage, infection, scarring,  need for further surgery, no change in vision, loss of vision, and  progressive disease despite intervention,  that these are the complications  of the surgery and the disease, with a rare complication of anesthesia of  even death.   DESCRIPTION OF PROCEDURE:  She understands the risks and wishes to proceed.  In the operating room, appropriate monitoring was followed by mild sedation.  Marcaine 0.75% delivered 5 mL retrobulbar followed by an additional 5 mL  laterally in the fashion of modified [name].  The right periocular region  was sterilely prepped and draped in the usual ophthalmic fashion.  A lid  speculum applied.  The conjunctival peritomy fashioned temporally and  superonasally.  A 4 mm infusion secured 3.5 mm posterior to the limbus in  the inferotemporal quadrant.  Placement in the vitreous cavity verified  visually.  Superior sclerotomies were fashioned.  The wall microscope  positioned, BIOM attachment applied.  Core vitrectomy was then begun.  Posterior hyaloid was tightly adherent to a large and fibrotic preretinal  tissue.  The modified en bloc technique as well as segmentation was  necessary because the flat, adherent tissues made  dissection very difficult  and dangerous to the retina.  Nonetheless, all traction in the macular  region was relieved.  The cental fovea remained attached.  A retinal hole in  the superotemporal arcade temporal to the macular region as well as  inferotemporally were identified and diathermy was used to mark these areas,  and subretinal fluid of the shallow rhegmatogenous detachment was drained.  A fluid-air exchange had been completed.  It must be also noted that prior  to the fluid-air exchange performance that the vitreous skirt had been  trimmed 360 degrees and all anterior-posterior traction had been released.  Scleral depression was then used to also trim the vitreous skirt further.  It was at this time that fluid-air exchange was then completed.  The retina  reattached nicely.  Endolaser photocoagulation was placed 360 degrees  peripherally as  well as in the areas of the detachment, as well as around  the retinal hole sites.  At this time a decision was made for prolonged  retinal tamponade to use silicone oil as well as to hasten her ambulatory  recovery.  An air-silicone oil was performed passively.  No complication  occurred.  Appropriate intraocular pressure was maintained.  Superior  sclerotomy was then closed with 7-0 Vicryl suture.  The infusion removed and  similarly closed with 7-0 Vicryl suture.  The conjunctiva was then closed  with 7-0 Vicryl suture.  Subconjunctival injection of antibiotic and steroid  applied.  Intraocular pressure assessed and found to be adequate.  A sterile  patch and Fox shield applied to the right eye.  She was taken to the  recovery room in good, stable condition.                                               Alford Highland Rankin, M.D.    GAR/MEDQ  D:  01/25/2003  T:  01/26/2003  Job:  [id number]

## 2011-05-11 NOTE — Op Note (Signed)
NAME:  Barbara Cummings, Barbara Cummings                ACCOUNT NO.:  [id number]   MEDICAL RECORD NO.:  [id number]          PATIENT TYPE:  AMB   LOCATION:  DAY                           FACILITY:  [facility]   PHYSICIAN:  R. Roetta Sessions, M.D. DATE OF BIRTH:  08/03/1927   DATE OF PROCEDURE:  09/12/2006  DATE OF DISCHARGE:                                 OPERATIVE REPORT   PROCEDURE:  Esophagogastroduodenoscopy with small-bowel biopsy followed by  gastric biopsies.   INDICATIONS FOR PROCEDURE:  Acute on chronic anemia, Hemoccult-positive  __________ capsule study suggested proximal duodenal telangiectasias and  gastric erosions.  She was previously treated for H. pylori.  She is now on  acid suppression therapy now. She is not taking any NSAIDs.  EGD is now  being done to reassess her upper GI tract.  This approach has been discussed  with the patient at length.  The potential risks, benefits, and alternatives  have been reviewed; and questions answered; She is agreeable.  Please see  the documentation in the medical record.   PROCEDURE NOTE:  O2 saturation, blood pressure, pulse and respirations were  monitored throughout the entire procedure.   CONSCIOUS SEDATION:  Versed 2 mg IV, Demerol 50 mg IV, Cetacaine spray for  topical oropharyngeal anesthesia.   INSTRUMENT:  Olympus video chip system.   FINDINGS:  Examination of the tubular esophagus revealed no mucosal  abnormalities.  The EG junction was easily traversed.   STOMACH:  The gastric cavity was empty.  It insufflated well with air.  A  thorough examination of the gastric mucosa on retroflexion of the proximal  stomach and esophagogastric junction demonstrated multiple antral erosions  but no frank ulcer or infiltrating process seen.  The pylorus was patent and  easily traversed.  Examination of the bulb and second portion revealed a  couple of lymphangiectasia.  There were some prominent folds in the duodenum  with some overlying pigmentation.  I  did not see any suspicious lesions for  bleeding; however.  Biopsies of the second portion of the duodenum were  taken as were gastric antral biopsies.  The patient tolerated the procedure  well and was reacted in endoscopy.   IMPRESSION:  1. Normal esophagus.  2. Antral erosions of uncertain significance.  Status post biopsy.  3. Otherwise normal stomach, patent pylorus, somewhat prominent      hyperpigmented duodenal folds of uncertain significance biopsied.   RECOMMENDATIONS:  1. We will check H. pylori stool antigen to document irradiation of H.      pylori.  2. Will followup on histology.  3. Will consider empiric acid suppression therapy pending results of      histology.      Jonathon Bellows, M.D.  Electronically Signed     RMR/MEDQ  D:  09/12/2006  T:  09/13/2006  Job:  [id number]   cc:   Kingsley Callander. Ouida Sills, MD  Fax: [phone]

## 2011-10-05 LAB — POCT I-STAT 4, (NA,K, GLUC, HGB,HCT)
Glucose, Bld: 63 — ABNORMAL LOW
Operator id: 274481
Potassium: 3.6

## 2011-10-09 LAB — CBC
MCHC: 33.3
MCV: 97.2
Platelets: 224
RDW: 17.4 — ABNORMAL HIGH

## 2011-10-09 LAB — POCT I-STAT 4, (NA,K, GLUC, HGB,HCT)
Glucose, Bld: 72
HCT: 36
Hemoglobin: 12.2
Potassium: 4.3

## 2011-10-09 LAB — POTASSIUM: Potassium: 3.9

## 2011-10-09 LAB — APTT: aPTT: 25

## 2011-11-22 ENCOUNTER — Ambulatory Visit: Payer: Self-pay | Admitting: Vascular Surgery

## 2011-11-22 DIAGNOSIS — I10 Essential (primary) hypertension: Secondary | ICD-10-CM

## 2011-11-26 ENCOUNTER — Emergency Department: Payer: Self-pay | Admitting: Emergency Medicine

## 2011-11-27 ENCOUNTER — Ambulatory Visit: Payer: Self-pay | Admitting: Vascular Surgery

## 2011-11-29 LAB — PATHOLOGY REPORT

## 2012-07-08 ENCOUNTER — Ambulatory Visit: Payer: Self-pay | Admitting: Vascular Surgery

## 2014-07-20 ENCOUNTER — Ambulatory Visit: Payer: Self-pay | Admitting: Vascular Surgery

## 2014-08-31 ENCOUNTER — Inpatient Hospital Stay (HOSPITAL_COMMUNITY)
Admission: EM | Admit: 2014-08-31 | Discharge: 2014-09-02 | DRG: 242 | Disposition: A | Discharge status: Home / Self Care | Payer: Medicare Other | Attending: Internal Medicine | Admitting: Internal Medicine

## 2014-08-31 ENCOUNTER — Emergency Department (HOSPITAL_COMMUNITY): Payer: Medicare Other

## 2014-08-31 ENCOUNTER — Encounter (HOSPITAL_COMMUNITY): Payer: Self-pay | Admitting: Emergency Medicine

## 2014-08-31 DIAGNOSIS — I2510 Atherosclerotic heart disease of native coronary artery without angina pectoris: Secondary | ICD-10-CM

## 2014-08-31 DIAGNOSIS — T68XXXA Hypothermia, initial encounter: Secondary | ICD-10-CM | POA: Diagnosis present

## 2014-08-31 DIAGNOSIS — Z87891 Personal history of nicotine dependence: Secondary | ICD-10-CM

## 2014-08-31 DIAGNOSIS — N2581 Secondary hyperparathyroidism of renal origin: Secondary | ICD-10-CM | POA: Diagnosis present

## 2014-08-31 DIAGNOSIS — I442 Atrioventricular block, complete: Principal | ICD-10-CM

## 2014-08-31 DIAGNOSIS — E872 Acidosis, unspecified: Secondary | ICD-10-CM | POA: Diagnosis present

## 2014-08-31 DIAGNOSIS — J189 Pneumonia, unspecified organism: Secondary | ICD-10-CM | POA: Diagnosis present

## 2014-08-31 DIAGNOSIS — I459 Conduction disorder, unspecified: Secondary | ICD-10-CM | POA: Diagnosis present

## 2014-08-31 DIAGNOSIS — N189 Chronic kidney disease, unspecified: Secondary | ICD-10-CM

## 2014-08-31 DIAGNOSIS — I509 Heart failure, unspecified: Secondary | ICD-10-CM | POA: Diagnosis present

## 2014-08-31 DIAGNOSIS — R001 Bradycardia, unspecified: Secondary | ICD-10-CM

## 2014-08-31 DIAGNOSIS — R531 Weakness: Secondary | ICD-10-CM | POA: Diagnosis present

## 2014-08-31 DIAGNOSIS — E875 Hyperkalemia: Secondary | ICD-10-CM

## 2014-08-31 DIAGNOSIS — IMO0002 Reserved for concepts with insufficient information to code with codable children: Secondary | ICD-10-CM

## 2014-08-31 DIAGNOSIS — E782 Mixed hyperlipidemia: Secondary | ICD-10-CM | POA: Diagnosis present

## 2014-08-31 DIAGNOSIS — R64 Cachexia: Secondary | ICD-10-CM | POA: Diagnosis present

## 2014-08-31 DIAGNOSIS — J810 Acute pulmonary edema: Secondary | ICD-10-CM

## 2014-08-31 DIAGNOSIS — Z992 Dependence on renal dialysis: Secondary | ICD-10-CM

## 2014-08-31 DIAGNOSIS — Z951 Presence of aortocoronary bypass graft: Secondary | ICD-10-CM

## 2014-08-31 DIAGNOSIS — D649 Anemia, unspecified: Secondary | ICD-10-CM | POA: Diagnosis present

## 2014-08-31 DIAGNOSIS — I428 Other cardiomyopathies: Secondary | ICD-10-CM | POA: Diagnosis present

## 2014-08-31 DIAGNOSIS — I498 Other specified cardiac arrhythmias: Secondary | ICD-10-CM | POA: Diagnosis not present

## 2014-08-31 DIAGNOSIS — E119 Type 2 diabetes mellitus without complications: Secondary | ICD-10-CM | POA: Diagnosis present

## 2014-08-31 DIAGNOSIS — I248 Other forms of acute ischemic heart disease: Secondary | ICD-10-CM | POA: Diagnosis present

## 2014-08-31 DIAGNOSIS — A419 Sepsis, unspecified organism: Secondary | ICD-10-CM

## 2014-08-31 DIAGNOSIS — I120 Hypertensive chronic kidney disease with stage 5 chronic kidney disease or end stage renal disease: Secondary | ICD-10-CM | POA: Diagnosis present

## 2014-08-31 DIAGNOSIS — R601 Generalized edema: Secondary | ICD-10-CM | POA: Diagnosis present

## 2014-08-31 DIAGNOSIS — N186 End stage renal disease: Secondary | ICD-10-CM

## 2014-08-31 DIAGNOSIS — I2489 Other forms of acute ischemic heart disease: Secondary | ICD-10-CM

## 2014-08-31 HISTORY — DX: Personal history of other diseases of the circulatory system: Z86.79

## 2014-08-31 HISTORY — DX: End stage renal disease: Z99.2

## 2014-08-31 HISTORY — DX: Essential (primary) hypertension: I10

## 2014-08-31 HISTORY — DX: End stage renal disease: N18.6

## 2014-08-31 HISTORY — DX: Anemia, unspecified: D64.9

## 2014-08-31 HISTORY — DX: Type 2 diabetes mellitus without complications: E11.9

## 2014-08-31 HISTORY — DX: Mixed hyperlipidemia: E78.2

## 2014-08-31 HISTORY — DX: Atherosclerotic heart disease of native coronary artery without angina pectoris: I25.10

## 2014-08-31 HISTORY — DX: Dependence on renal dialysis: N18.6

## 2014-08-31 LAB — COMPREHENSIVE METABOLIC PANEL
ALT: 27 U/L (ref 0–35)
AST: 33 U/L (ref 0–37)
Albumin: 3.2 g/dL — ABNORMAL LOW (ref 3.5–5.2)
Alkaline Phosphatase: 221 U/L — ABNORMAL HIGH (ref 39–117)
Anion gap: 25 — ABNORMAL HIGH (ref 5–15)
BUN: 90 mg/dL — ABNORMAL HIGH (ref 6–23)
CALCIUM: 7.6 mg/dL — AB (ref 8.4–10.5)
CO2: 15 meq/L — AB (ref 19–32)
CREATININE: 11.16 mg/dL — AB (ref 0.50–1.10)
Chloride: 98 mEq/L (ref 96–112)
GFR, EST AFRICAN AMERICAN: 3 mL/min — AB (ref >90)
GFR, EST NON AFRICAN AMERICAN: 3 mL/min — AB (ref >90)
GLUCOSE: 332 mg/dL — AB (ref 70–99)
Potassium: 5.5 mEq/L — ABNORMAL HIGH (ref 3.7–5.3)
Sodium: 138 mEq/L (ref 137–147)
Total Bilirubin: 0.6 mg/dL (ref 0.3–1.2)
Total Protein: 6.9 g/dL (ref 6.0–8.3)

## 2014-08-31 LAB — TROPONIN I: Troponin I: <0.3 ng/mL (ref <0.30)

## 2014-08-31 LAB — CBC
HCT: 31.2 % — ABNORMAL LOW (ref 36.0–46.0)
HEMOGLOBIN: 10.2 g/dL — AB (ref 12.0–15.0)
MCH: 30.6 pg (ref 26.0–34.0)
MCHC: 32.7 g/dL (ref 30.0–36.0)
MCV: 93.7 fL (ref 78.0–100.0)
Platelets: 177 10*3/uL (ref 150–400)
RBC: 3.33 MIL/uL — AB (ref 3.87–5.11)
RDW: 14.8 % (ref 11.5–15.5)
WBC: 9 10*3/uL (ref 4.0–10.5)

## 2014-08-31 LAB — PROTIME-INR
INR: 1.33 (ref 0.00–1.49)
PROTHROMBIN TIME: 16.5 s — AB (ref 11.6–15.2)

## 2014-08-31 LAB — APTT: aPTT: 33 seconds (ref 24–37)

## 2014-08-31 MED ORDER — NEPRO/CARBSTEADY PO LIQD
237.0000 mL | ORAL | Status: DC | PRN
  Filled 2014-08-31: qty 237

## 2014-08-31 MED ORDER — ATROPINE SULFATE 0.1 MG/ML IJ SOLN
0.5000 mg | Freq: Once | INTRAMUSCULAR | Status: AC
  Administered 2014-08-31: 0.5 mg via INTRAVENOUS
  Filled 2014-08-31: qty 10

## 2014-08-31 MED ORDER — SODIUM CHLORIDE 0.9 % IV SOLN: 100.0000 mL | INTRAVENOUS | Status: DC | PRN

## 2014-08-31 MED ORDER — EPOETIN ALFA 10000 UNIT/ML IJ SOLN
10000.0000 [IU] | INTRAMUSCULAR | Status: DC
  Filled 2014-08-31: qty 1

## 2014-08-31 MED ORDER — SODIUM CHLORIDE 0.9 % IV SOLN
1.0000 g | Freq: Once | INTRAVENOUS | Status: AC
  Administered 2014-08-31: 1 g via INTRAVENOUS
  Filled 2014-08-31: qty 10

## 2014-08-31 MED ORDER — PENTAFLUOROPROP-TETRAFLUOROETH EX AERO
1.0000 "application " | INHALATION_SPRAY | CUTANEOUS | Status: DC | PRN
  Filled 2014-08-31: qty 30

## 2014-08-31 MED ORDER — ALTEPLASE 2 MG IJ SOLR
2.0000 mg | Freq: Once | INTRAMUSCULAR | Status: AC | PRN
  Filled 2014-08-31: qty 2

## 2014-08-31 MED ORDER — CALCIUM CHLORIDE 10 % IV SOLN
INTRAVENOUS | Status: AC
  Filled 2014-08-31: qty 10

## 2014-08-31 MED ORDER — INSULIN ASPART 100 UNIT/ML IV SOLN
10.0000 [IU] | Freq: Once | INTRAVENOUS | Status: AC
  Administered 2014-08-31: 10 [IU] via INTRAVENOUS

## 2014-08-31 MED ORDER — ALBUTEROL SULFATE (2.5 MG/3ML) 0.083% IN NEBU
10.0000 mg | INHALATION_SOLUTION | Freq: Once | RESPIRATORY_TRACT | Status: AC
  Administered 2014-08-31: 10 mg via RESPIRATORY_TRACT
  Filled 2014-08-31: qty 12

## 2014-08-31 MED ORDER — HEPARIN SODIUM (PORCINE) 1000 UNIT/ML DIALYSIS
1000.0000 [IU] | INTRAMUSCULAR | Status: DC | PRN
  Filled 2014-08-31: qty 1

## 2014-08-31 MED ORDER — LIDOCAINE HCL (PF) 1 % IJ SOLN
5.0000 mL | INTRAMUSCULAR | Status: DC | PRN
Start: 2014-08-31 — End: 2014-09-02

## 2014-08-31 MED ORDER — SODIUM BICARBONATE 8.4 % IV SOLN
50.0000 meq | Freq: Once | INTRAVENOUS | Status: AC
  Administered 2014-08-31: 50 meq via INTRAVENOUS
  Filled 2014-08-31: qty 50

## 2014-08-31 MED ORDER — DEXTROSE 50 % IV SOLN
1.0000 | Freq: Once | INTRAVENOUS | Status: AC
  Administered 2014-08-31: 50 mL via INTRAVENOUS

## 2014-08-31 MED ORDER — SODIUM CHLORIDE 0.9 % IV SOLN
1000.0000 mL | INTRAVENOUS | Status: DC
  Administered 2014-08-31: 1000 mL via INTRAVENOUS

## 2014-08-31 MED ORDER — INSULIN ASPART 100 UNIT/ML ~~LOC~~ SOLN
SUBCUTANEOUS | Status: AC
  Administered 2014-08-31: 10 [IU] via INTRAVENOUS
  Filled 2014-08-31: qty 1

## 2014-08-31 MED ORDER — LIDOCAINE-PRILOCAINE 2.5-2.5 % EX CREA
1.0000 "application " | TOPICAL_CREAM | CUTANEOUS | Status: DC | PRN
  Filled 2014-08-31: qty 5

## 2014-08-31 MED ORDER — DEXTROSE 50 % IV SOLN
INTRAVENOUS | Status: AC
  Administered 2014-08-31: 50 mL via INTRAVENOUS
  Filled 2014-08-31: qty 50

## 2014-08-31 MED ORDER — SODIUM CHLORIDE 0.9 % IV SOLN
100.0000 mL | INTRAVENOUS | Status: DC | PRN
Start: 2014-08-31 — End: 2014-09-02

## 2014-08-31 NOTE — ED Notes (Signed)
Pt resting, no longer moaning with inspiration. HR staying in the 40s per the monitor. Pt states nausea has subsided somewhat. No additional complaints at this time.

## 2014-08-31 NOTE — ED Notes (Signed)
Pt lethargic but easily arousable. Pt answers questions correctly, alert and orientated. Family reports that "patient was out of her head. Waving her arms around, not talking, and if you touched her skin she would yell". Pt had dialysis on Friday and went again on Monday but was unable to complete treatment as pt had a blockage in dialysis fistula. Pt has two fistulas, one in each arm. Pt's family states the right arm is used for dialysis and the left arm is no longer in use so that arm is used for blood draws and blood pressures. Pt's family states IVs are usually started in her feet, legs or neck.

## 2014-08-31 NOTE — ED Notes (Signed)
Per ems report, the pt family called ems for the pt being lethargic and not responding. On arrival to home, the pt was able to speak. Low 02 saturation, placed on NRB. Pt family reports she is a dialysis pt and went to dialysis yesterday, (unsure if dialysis was performed) pt needs to return to have her port unclogged.

## 2014-08-31 NOTE — ED Notes (Signed)
Pt c/o nausea. EDP made aware. No new orders at this time.

## 2014-08-31 NOTE — ED Notes (Signed)
MD at bedside. 

## 2014-08-31 NOTE — ED Provider Notes (Signed)
CSN: [id number]     Arrival date & time 08/31/14  2059 History  This chart was scribed for Linwood Dibbles, MD by Bronson Curb, ED Scribe. This patient was seen in room APA09/APA09 and the patient's care was started at 9:23 PM.    Chief Complaint  Patient presents with  . Weakness    The history is provided by the patient. No language interpreter was used.   HPI Comments: Barbara Cummings is a 80 y.o. female, with history of renal disorder, brought in by ambulance, who presents to the Emergency Department complaining of weakness onset PTA. Patient states she is unsure of why she is here and denies any pain or SOB at present. Per nursing note, the family called EMS due the patient being lethargic and unresponsive. Due to low O2 saturation, patient was placed on NRB. She is a dialysis patient, with her last treatment 4 days ago. Patient went to dialysis yesterday, however, a blockage was found "in her arm" (unsure if dialysis was performed).     Past Medical History  Diagnosis Date  . Renal disorder    History reviewed. No pertinent past surgical history. No family history on file. History  Substance Use Topics  . Smoking status: Not on file  . Smokeless tobacco: Not on file  . Alcohol Use: Not on file   OB History   Grav Para Term Preterm Abortions TAB SAB Ect Mult Living                 Review of Systems A complete 10 system review of systems was obtained and all systems are negative except as noted in the HPI and PMH.     Allergies  Review of patient's allergies indicates no known allergies.  Home Medications   Prior to Admission medications   Medication Sig Start Date End Date Taking? Authorizing Provider  cinacalcet (SENSIPAR) 30 MG tablet Take 30 mg by mouth daily.   Yes Historical Provider, MD  glipiZIDE (GLUCOTROL) 5 MG tablet Take by mouth daily before breakfast.   Yes Historical Provider, MD  sevelamer carbonate (RENVELA) 800 MG tablet Take 1,600 mg by mouth 3 (three)  times daily with meals.   Yes Historical Provider, MD  simvastatin (ZOCOR) 40 MG tablet Take 40 mg by mouth daily.   Yes Historical Provider, MD   BP 189/56  Pulse 44  Resp 24  SpO2 100%  Physical Exam  Nursing note and vitals reviewed. Constitutional:  Patient moaning, but denies complaints.  HENT:  Head: Normocephalic and atraumatic.  Right Ear: External ear normal.  Left Ear: External ear normal.  Mouth/Throat: No oropharyngeal exudate.  Eyes: Conjunctivae are normal. Right eye exhibits no discharge. Left eye exhibits no discharge. No scleral icterus.  Neck: Neck supple. JVD present. No tracheal deviation present. No thyromegaly present.  Cardiovascular: Normal rate, regular rhythm and intact distal pulses.   Pulmonary/Chest: Breath sounds normal. Accessory muscle usage present. No stridor. Tachypnea noted. No respiratory distress. She has no decreased breath sounds. She has no wheezes. She has no rhonchi. She has no rales.  Abdominal: Soft. Bowel sounds are normal. She exhibits no distension. There is no tenderness. There is no rebound and no guarding.  Musculoskeletal: She exhibits no edema and no tenderness.  Neurological: She is alert. She has normal strength. No cranial nerve deficit (no facial droop, extraocular movements intact, no slurred speech) or sensory deficit. She exhibits normal muscle tone. She displays no seizure activity. Coordination normal.  Skin: Skin is  warm and dry. No rash noted. She is not diaphoretic.  Psychiatric: She has a normal mood and affect.    ED Course  Procedures (including critical care time)  DIAGNOSTIC STUDIES: Oxygen Saturation is 86% on room air, low by my interpretation. (100% on NRB)  COORDINATION OF CARE: At 2128 Discussed treatment plan with patient which includes labs and CXR. Patient agrees.   Angiocath insertion Performed by: QIONG,EXB Consent: Verbal consent obtained. Risks and benefits: risks, benefits and alternatives were  discussed Time out: Immediately prior to procedure a "time out" was called to verify the correct patient, procedure, equipment, support staff and site/side marked as required. Preparation: Patient was prepped and draped in the usual sterile fashion. Vein Location: left EJ Gauge: 20 Normal blood return and flush without difficulty Patient tolerance: Patient tolerated the procedure well with no immediate complications.   CRITICAL CARE Performed by: MWUXL,KGM Total critical care time: 45 Critical care time was exclusive of separately billable procedures and treating other patients. Critical care was necessary to treat or prevent imminent or life-threatening deterioration. Critical care was time spent personally by me on the following activities: development of treatment plan with patient and/or surrogate as well as nursing, discussions with consultants, evaluation of patient's response to treatment, examination of patient, obtaining history from patient or surrogate, ordering and performing treatments and interventions, ordering and review of laboratory studies, ordering and review of radiographic studies, pulse oximetry and re-evaluation of patient's condition.  Labs Review Labs Reviewed  CBC - Abnormal; Notable for the following:    RBC 3.33 (*)    Hemoglobin 10.2 (*)    HCT 31.2 (*)    All other components within normal limits  COMPREHENSIVE METABOLIC PANEL - Abnormal; Notable for the following:    Potassium 5.5 (*)    CO2 15 (*)    Glucose, Bld 332 (*)    BUN 90 (*)    Creatinine, Ser 11.16 (*)    Calcium 7.6 (*)    Albumin 3.2 (*)    Alkaline Phosphatase 221 (*)    GFR calc non Af Amer 3 (*)    GFR calc Af Amer 3 (*)    Anion gap 25 (*)    All other components within normal limits  PROTIME-INR - Abnormal; Notable for the following:    Prothrombin Time 16.5 (*)    All other components within normal limits  APTT  TROPONIN I    Imaging Review Dg Chest Portable 1  View  08/31/2014   CLINICAL DATA:  Shortness of breath and weakness. History of end-stage renal disease.  EXAM: PORTABLE CHEST - 1 VIEW  COMPARISON:  08/18/2007  FINDINGS: Grossly unchanged enlarged cardiac silhouette and mediastinal contours post median sternotomy and CABG. Atherosclerotic plaque within the thoracic aorta. The pulmonary vasculature is indistinct with cephalization of flow. Worsening bilateral infrahilar and left basilar heterogeneous/consolidative opacities. Trace bilateral effusions are not excluded. No pneumothorax. Unchanged bones.  IMPRESSION: Findings compatible with pulmonary edema and bibasilar opacities, left greater than right, atelectasis versus infiltrate. A follow-up chest radiograph in 4 to 6 weeks after treatment is recommended to ensure resolution.   Electronically Signed   By: Simonne Come M.D.   On: 08/31/2014 22:05     EKG Interpretation   Date/Time:  Tuesday August 31 2014 21:04:21 EDT Ventricular Rate:  44 PR Interval:  494 QRS Duration: 156 QT Interval:  695 QTC Calculation: 595 R Axis:   -61 Text Interpretation:  Suspect complete heart block  RBBB and  LAFB LVH with  secondary repolarization abnormality QRS widening since last tracing, st  and t wave changes since last tracing Reconfirmed by Florean Hoobler  MD-J, Machael Raine  [phone]) on 08/31/2014 9:44:03 PM     Medications  0.9 %  sodium chloride infusion (1,000 mLs Intravenous New Bag/Given 08/31/14 2205)  calcium chloride 10 % injection (not administered)  atropine 0.1 MG/ML injection 0.5 mg (not administered)  calcium chloride 1 g in sodium chloride 0.9 % 100 mL IVPB (0 g Intravenous Stopped 08/31/14 2245)  insulin aspart (novoLOG) injection 10 Units (10 Units Intravenous Given 08/31/14 2208)  dextrose 50 % solution 50 mL (50 mLs Intravenous Given 08/31/14 2207)  albuterol (PROVENTIL) (2.5 MG/3ML) 0.083% nebulizer solution 10 mg (10 mg Nebulization Given 08/31/14 2146)  sodium bicarbonate injection 50 mEq (50 mEq  Intravenous Given 08/31/14 2213)    MDM  Initial EKG suggests a heart block.  I am concerned that she may be hyperkalemic with her recent missed dialysis.  Empiric treatment for hyperkalemia while labs are pending.  Pacer pads have been placed on the patient.   Final diagnoses:  Acute pulmonary edema  Metabolic acidosis  Chronic renal failure, unspecified stage  Bradycardia    Pt's cxr shows pulm edema.   Metabolic acidosis with bradycardia but no significant hyperkalemia.  I am concerned that her acidosis is contributing to the heart block.  0.5 mg atropine ordered.  Will continue to monitor.  BP remains elevated.  I consult with Dr Kristian Covey regarding emergent dialysis this evening.  Pt will also need a dialysis catheter as her AV fistula was not functioning on Monday.  Discussed with Dr Lovell Sheehan who will evaluate the patient in the ED.   I personally performed the services described in this documentation, which was scribed in my presence.  The recorded information has been reviewed and is accurate.    Linwood Dibbles, MD 08/31/14 [phone]

## 2014-08-31 NOTE — Consult Note (Signed)
Reason for Consult: CHF and end-stage renal disease Referring Physician: Dr.Knapp  Barbara Cummings is an 80 y.o. female.  HPI: She is a patient who has history of hypertension, anemia and end-stage renal disease presently came with complaints of difficulty in breathing. Patient was seen in dialysis yesterday and she was told fistula was clotted. Since because of the holiday recommend arrangement for declotting and she was scheduled for tomorrow. In the meantime patient to start having difficulty breathing, orthopnea and paroxysmal nocturnal dyspnea hence came to the hospital. Presently patient is feeling slightly better. Shouldn't have any nausea vomiting. Her potassium also his high.  Past Medical History  Diagnosis Date  . Renal disorder     History reviewed. No pertinent past surgical history.  No family history on file.  Social History:  has no tobacco, alcohol, and drug history on file.  Allergies: No Known Allergies  Medications:  I have reviewed the patient's current medications. Scheduled: . atropine  0.5 mg Intravenous Once  . calcium chloride        Results for orders placed during the hospital encounter of 08/31/14 (from the past 48 hour(s))  APTT     Status: None   Collection Time    08/31/14  9:31 PM      Result Value Ref Range   aPTT 33  24 - 37 seconds  CBC     Status: Abnormal   Collection Time    08/31/14  9:31 PM      Result Value Ref Range   WBC 9.0  4.0 - 10.5 K/uL   RBC 3.33 (*) 3.87 - 5.11 MIL/uL   Hemoglobin 10.2 (*) 12.0 - 15.0 g/dL   HCT 31.2 (*) 36.0 - 46.0 %   MCV 93.7  78.0 - 100.0 fL   MCH 30.6  26.0 - 34.0 pg   MCHC 32.7  30.0 - 36.0 g/dL   RDW 14.8  11.5 - 15.5 %   Platelets 177  150 - 400 K/uL  COMPREHENSIVE METABOLIC PANEL     Status: Abnormal   Collection Time    08/31/14  9:31 PM      Result Value Ref Range   Sodium 138  137 - 147 mEq/L   Potassium 5.5 (*) 3.7 - 5.3 mEq/L   Chloride 98  96 - 112 mEq/L   CO2 15 (*) 19 - 32 mEq/L    Glucose, Bld 332 (*) 70 - 99 mg/dL   BUN 90 (*) 6 - 23 mg/dL   Creatinine, Ser 11.16 (*) 0.50 - 1.10 mg/dL   Calcium 7.6 (*) 8.4 - 10.5 mg/dL   Total Protein 6.9  6.0 - 8.3 g/dL   Albumin 3.2 (*) 3.5 - 5.2 g/dL   AST 33  0 - 37 U/L   ALT 27  0 - 35 U/L   Alkaline Phosphatase 221 (*) 39 - 117 U/L   Total Bilirubin 0.6  0.3 - 1.2 mg/dL   GFR calc non Af Amer 3 (*) >90 mL/min   GFR calc Af Amer 3 (*) >90 mL/min   Comment: (NOTE)     The eGFR has been calculated using the CKD EPI equation.     This calculation has not been validated in all clinical situations.     eGFR's persistently <90 mL/min signify possible Chronic Kidney     Disease.   Anion gap 25 (*) 5 - 15  PROTIME-INR     Status: Abnormal   Collection Time    08/31/14  9:31 PM      Result Value Ref Range   Prothrombin Time 16.5 (*) 11.6 - 15.2 seconds   INR 1.33  0.00 - 1.49  TROPONIN I     Status: None   Collection Time    08/31/14  9:31 PM      Result Value Ref Range   Troponin I <0.30  <0.30 ng/mL   Comment:            Due to the release kinetics of cTnI,     a negative result within the first hours     of the onset of symptoms does not rule out     myocardial infarction with certainty.     If myocardial infarction is still suspected,     repeat the test at appropriate intervals.    Dg Chest Portable 1 View  08/31/2014   CLINICAL DATA:  Shortness of breath and weakness. History of end-stage renal disease.  EXAM: PORTABLE CHEST - 1 VIEW  COMPARISON:  08/18/2007  FINDINGS: Grossly unchanged enlarged cardiac silhouette and mediastinal contours post median sternotomy and CABG. Atherosclerotic plaque within the thoracic aorta. The pulmonary vasculature is indistinct with cephalization of flow. Worsening bilateral infrahilar and left basilar heterogeneous/consolidative opacities. Trace bilateral effusions are not excluded. No pneumothorax. Unchanged bones.  IMPRESSION: Findings compatible with pulmonary edema and bibasilar  opacities, left greater than right, atelectasis versus infiltrate. A follow-up chest radiograph in 4 to 6 weeks after treatment is recommended to ensure resolution.   Electronically Signed   By: Sandi Mariscal M.D.   On: 08/31/2014 22:05    Review of Systems  Constitutional: Negative for fever and chills.  Respiratory: Positive for shortness of breath.   Cardiovascular: Positive for orthopnea and PND.  Gastrointestinal: Negative for nausea and vomiting.   Blood pressure 181/46, pulse 46, temperature 96 F (35.6 C), temperature source Rectal, resp. rate 15, SpO2 99.00%. Physical Exam  Constitutional: She appears distressed.  Eyes: No scleral icterus.  Neck: JVD present.  Cardiovascular: Normal rate and regular rhythm.   Respiratory: She is in respiratory distress. She has wheezes. She has rales.  Musculoskeletal: She exhibits edema.    Assessment/Plan: Problem #1 difficulty in breathing: This is the secondary to CHF as patient didn't get dialysis for the last 3 days. Presently she is on oxygen seems feeling better. Problem #2 hypertension: Her blood pressure is on a seems to be high. Problem #3 hyperkalemia Problem #4 end-stage renal disease she status post hemodialysis on Friday presently she doesn't have any nausea or vomiting. Problem #5 coronary artery disease: Status post CABG she is asymptomatic. Problem #6 history of diabetes Problem #7 anemia: Her hemoglobin hematocrit is range Problem #8 metabolic acidosis Plan: We'll make arrangements for patient to get dialysis today We'll use 2K/2.5calcium bath and remote 3 L if her blood pressure tolerates. We'll check her basic metabolic panel in the morning. Annemarie Sebree S 08/31/2014, 11:18 PM

## 2014-09-01 ENCOUNTER — Encounter (HOSPITAL_COMMUNITY): Payer: Self-pay | Admitting: Emergency Medicine

## 2014-09-01 ENCOUNTER — Encounter (HOSPITAL_COMMUNITY)
Admission: EM | Disposition: A | Discharge status: Home / Self Care | Payer: Self-pay | Source: Home / Self Care | Attending: Internal Medicine

## 2014-09-01 DIAGNOSIS — I442 Atrioventricular block, complete: Secondary | ICD-10-CM

## 2014-09-01 DIAGNOSIS — N2581 Secondary hyperparathyroidism of renal origin: Secondary | ICD-10-CM | POA: Diagnosis present

## 2014-09-01 DIAGNOSIS — J189 Pneumonia, unspecified organism: Secondary | ICD-10-CM | POA: Diagnosis present

## 2014-09-01 DIAGNOSIS — Z951 Presence of aortocoronary bypass graft: Secondary | ICD-10-CM | POA: Diagnosis not present

## 2014-09-01 DIAGNOSIS — I498 Other specified cardiac arrhythmias: Secondary | ICD-10-CM | POA: Diagnosis present

## 2014-09-01 DIAGNOSIS — I509 Heart failure, unspecified: Secondary | ICD-10-CM | POA: Diagnosis present

## 2014-09-01 DIAGNOSIS — Z87891 Personal history of nicotine dependence: Secondary | ICD-10-CM | POA: Diagnosis not present

## 2014-09-01 DIAGNOSIS — E875 Hyperkalemia: Secondary | ICD-10-CM

## 2014-09-01 DIAGNOSIS — Z992 Dependence on renal dialysis: Secondary | ICD-10-CM | POA: Diagnosis not present

## 2014-09-01 DIAGNOSIS — E119 Type 2 diabetes mellitus without complications: Secondary | ICD-10-CM | POA: Diagnosis present

## 2014-09-01 DIAGNOSIS — J810 Acute pulmonary edema: Secondary | ICD-10-CM

## 2014-09-01 DIAGNOSIS — I120 Hypertensive chronic kidney disease with stage 5 chronic kidney disease or end stage renal disease: Secondary | ICD-10-CM | POA: Diagnosis present

## 2014-09-01 DIAGNOSIS — E872 Acidosis, unspecified: Secondary | ICD-10-CM | POA: Diagnosis present

## 2014-09-01 DIAGNOSIS — T68XXXA Hypothermia, initial encounter: Secondary | ICD-10-CM | POA: Diagnosis present

## 2014-09-01 DIAGNOSIS — R531 Weakness: Secondary | ICD-10-CM | POA: Diagnosis present

## 2014-09-01 DIAGNOSIS — I2510 Atherosclerotic heart disease of native coronary artery without angina pectoris: Secondary | ICD-10-CM | POA: Diagnosis present

## 2014-09-01 DIAGNOSIS — E782 Mixed hyperlipidemia: Secondary | ICD-10-CM | POA: Diagnosis present

## 2014-09-01 DIAGNOSIS — A419 Sepsis, unspecified organism: Secondary | ICD-10-CM

## 2014-09-01 DIAGNOSIS — N186 End stage renal disease: Secondary | ICD-10-CM | POA: Diagnosis present

## 2014-09-01 DIAGNOSIS — R601 Generalized edema: Secondary | ICD-10-CM | POA: Diagnosis present

## 2014-09-01 DIAGNOSIS — R64 Cachexia: Secondary | ICD-10-CM | POA: Diagnosis present

## 2014-09-01 DIAGNOSIS — I248 Other forms of acute ischemic heart disease: Secondary | ICD-10-CM | POA: Diagnosis present

## 2014-09-01 DIAGNOSIS — D649 Anemia, unspecified: Secondary | ICD-10-CM | POA: Diagnosis present

## 2014-09-01 DIAGNOSIS — IMO0002 Reserved for concepts with insufficient information to code with codable children: Secondary | ICD-10-CM | POA: Diagnosis not present

## 2014-09-01 DIAGNOSIS — I428 Other cardiomyopathies: Secondary | ICD-10-CM | POA: Diagnosis present

## 2014-09-01 DIAGNOSIS — I459 Conduction disorder, unspecified: Secondary | ICD-10-CM | POA: Diagnosis present

## 2014-09-01 HISTORY — PX: PERMANENT PACEMAKER INSERTION: SHX5480

## 2014-09-01 LAB — CBC WITH DIFFERENTIAL/PLATELET
Basophils Absolute: 0 10*3/uL (ref 0.0–0.1)
Basophils Relative: 0 % (ref 0–1)
EOS ABS: 0 10*3/uL (ref 0.0–0.7)
Eosinophils Relative: 0 % (ref 0–5)
HEMATOCRIT: 30 % — AB (ref 36.0–46.0)
HEMOGLOBIN: 10.1 g/dL — AB (ref 12.0–15.0)
LYMPHS ABS: 1.1 10*3/uL (ref 0.7–4.0)
Lymphocytes Relative: 16 % (ref 12–46)
MCH: 30.9 pg (ref 26.0–34.0)
MCHC: 33.7 g/dL (ref 30.0–36.0)
MCV: 91.7 fL (ref 78.0–100.0)
MONO ABS: 0.5 10*3/uL (ref 0.1–1.0)
MONOS PCT: 8 % (ref 3–12)
NEUTROS PCT: 76 % (ref 43–77)
Neutro Abs: 5.3 10*3/uL (ref 1.7–7.7)
Platelets: 151 10*3/uL (ref 150–400)
RBC: 3.27 MIL/uL — AB (ref 3.87–5.11)
RDW: 14.6 % (ref 11.5–15.5)
WBC: 7 10*3/uL (ref 4.0–10.5)

## 2014-09-01 LAB — COMPREHENSIVE METABOLIC PANEL
ALT: 26 U/L (ref 0–35)
AST: 32 U/L (ref 0–37)
Albumin: 3.2 g/dL — ABNORMAL LOW (ref 3.5–5.2)
Alkaline Phosphatase: 213 U/L — ABNORMAL HIGH (ref 39–117)
Anion gap: 17 — ABNORMAL HIGH (ref 5–15)
BUN: 24 mg/dL — ABNORMAL HIGH (ref 6–23)
CO2: 26 mEq/L (ref 19–32)
Calcium: 9.9 mg/dL (ref 8.4–10.5)
Chloride: 100 mEq/L (ref 96–112)
Creatinine, Ser: 4.33 mg/dL — ABNORMAL HIGH (ref 0.50–1.10)
GFR, EST AFRICAN AMERICAN: 10 mL/min — AB (ref >90)
GFR, EST NON AFRICAN AMERICAN: 8 mL/min — AB (ref >90)
Glucose, Bld: 95 mg/dL (ref 70–99)
POTASSIUM: 3.7 meq/L (ref 3.7–5.3)
SODIUM: 143 meq/L (ref 137–147)
Total Bilirubin: 0.7 mg/dL (ref 0.3–1.2)
Total Protein: 7.3 g/dL (ref 6.0–8.3)

## 2014-09-01 LAB — GLUCOSE, CAPILLARY
Glucose-Capillary: 102 mg/dL — ABNORMAL HIGH (ref 70–99)
Glucose-Capillary: 84 mg/dL (ref 70–99)
Glucose-Capillary: 87 mg/dL (ref 70–99)

## 2014-09-01 LAB — TROPONIN I
Troponin I: <0.3 ng/mL (ref <0.30)
Troponin I: 0.34 ng/mL (ref <0.30)

## 2014-09-01 LAB — MRSA PCR SCREENING: MRSA by PCR: NEGATIVE

## 2014-09-01 LAB — HEPATITIS B SURFACE ANTIGEN: HEP B S AG: NEGATIVE

## 2014-09-01 LAB — HEMOGLOBIN A1C
Hgb A1c MFr Bld: 5.9 % — ABNORMAL HIGH (ref <5.7)
Mean Plasma Glucose: 123 mg/dL — ABNORMAL HIGH (ref <117)

## 2014-09-01 SURGERY — PERMANENT PACEMAKER INSERTION
Anesthesia: LOCAL

## 2014-09-01 MED ORDER — FENTANYL CITRATE 0.05 MG/ML IJ SOLN
INTRAMUSCULAR | Status: AC
  Filled 2014-09-01: qty 2

## 2014-09-01 MED ORDER — EPOETIN ALFA 10000 UNIT/ML IJ SOLN
10000.0000 [IU] | INTRAMUSCULAR | Status: DC
  Filled 2014-09-01: qty 1

## 2014-09-01 MED ORDER — LIDOCAINE HCL (PF) 1 % IJ SOLN
INTRAMUSCULAR | Status: AC
  Filled 2014-09-01: qty 30

## 2014-09-01 MED ORDER — DEXTROSE 5 % IV SOLN: 2.0000 g | INTRAVENOUS | Status: DC

## 2014-09-01 MED ORDER — DEXTROSE 5 % IV SOLN
2.0000 g | Freq: Once | INTRAVENOUS | Status: AC
  Administered 2014-09-01: 2 g via INTRAVENOUS
  Filled 2014-09-01: qty 2

## 2014-09-01 MED ORDER — SODIUM CHLORIDE 0.9 % IJ SOLN
3.0000 mL | INTRAMUSCULAR | Status: DC | PRN
Start: 2014-09-01 — End: 2014-09-02

## 2014-09-01 MED ORDER — DEXTROSE 5 % IV SOLN: 1.0000 g | INTRAVENOUS | Status: DC

## 2014-09-01 MED ORDER — CEFAZOLIN SODIUM 1-5 GM-% IV SOLN
1.0000 g | Freq: Four times a day (QID) | INTRAVENOUS | Status: DC
  Filled 2014-09-01 (×3): qty 50

## 2014-09-01 MED ORDER — DEXTROSE 5 % IV SOLN
INTRAVENOUS | Status: AC
  Filled 2014-09-01: qty 2

## 2014-09-01 MED ORDER — CINACALCET HCL 30 MG PO TABS
30.0000 mg | ORAL_TABLET | Freq: Every day | ORAL | Status: DC
  Administered 2014-09-01: 30 mg via ORAL
  Filled 2014-09-01 (×3): qty 1

## 2014-09-01 MED ORDER — ACETAMINOPHEN 325 MG PO TABS
325.0000 mg | ORAL_TABLET | ORAL | Status: DC | PRN
  Filled 2014-09-01: qty 2

## 2014-09-01 MED ORDER — SODIUM CHLORIDE 0.9 % IV SOLN
250.0000 mL | INTRAVENOUS | Status: DC | PRN
Start: 2014-09-01 — End: 2014-09-02

## 2014-09-01 MED ORDER — SIMVASTATIN 40 MG PO TABS
40.0000 mg | ORAL_TABLET | Freq: Every day | ORAL | Status: DC
  Administered 2014-09-01: 40 mg via ORAL
  Filled 2014-09-01 (×2): qty 1

## 2014-09-01 MED ORDER — CEFAZOLIN SODIUM 1-5 GM-% IV SOLN
1.0000 g | Freq: Two times a day (BID) | INTRAVENOUS | Status: AC
  Administered 2014-09-01 – 2014-09-02 (×2): 1 g via INTRAVENOUS
  Filled 2014-09-01 (×2): qty 50

## 2014-09-01 MED ORDER — SODIUM CHLORIDE 0.9 % IJ SOLN: 3.0000 mL | Freq: Two times a day (BID) | INTRAMUSCULAR | Status: DC

## 2014-09-01 MED ORDER — VANCOMYCIN HCL IN DEXTROSE 750-5 MG/150ML-% IV SOLN: 750.0000 mg | INTRAVENOUS | Status: DC

## 2014-09-01 MED ORDER — SODIUM CHLORIDE 0.9 % IR SOLN
80.0000 mg | Status: DC
  Filled 2014-09-01: qty 2

## 2014-09-01 MED ORDER — SEVELAMER CARBONATE 800 MG PO TABS
1600.0000 mg | ORAL_TABLET | Freq: Three times a day (TID) | ORAL | Status: DC
  Administered 2014-09-01: 1600 mg via ORAL
  Filled 2014-09-01 (×9): qty 2

## 2014-09-01 MED ORDER — ONDANSETRON HCL 4 MG/2ML IJ SOLN: 4.0000 mg | Freq: Four times a day (QID) | INTRAMUSCULAR | Status: DC | PRN

## 2014-09-01 MED ORDER — HEPARIN SODIUM (PORCINE) 5000 UNIT/ML IJ SOLN
5000.0000 [IU] | Freq: Three times a day (TID) | INTRAMUSCULAR | Status: DC
  Administered 2014-09-01 – 2014-09-02 (×3): 5000 [IU] via SUBCUTANEOUS
  Filled 2014-09-01 (×6): qty 1

## 2014-09-01 MED ORDER — HEPARIN (PORCINE) IN NACL 2-0.9 UNIT/ML-% IJ SOLN
INTRAMUSCULAR | Status: AC
  Filled 2014-09-01: qty 500

## 2014-09-01 MED ORDER — HEPARIN SODIUM (PORCINE) 5000 UNIT/ML IJ SOLN: 5000.0000 [IU] | Freq: Three times a day (TID) | INTRAMUSCULAR | Status: DC

## 2014-09-01 MED ORDER — DOPAMINE-DEXTROSE 3.2-5 MG/ML-% IV SOLN
2.5000 ug/kg/min | INTRAVENOUS | Status: DC
  Administered 2014-09-01: 2.5 ug/kg/min via INTRAVENOUS
  Filled 2014-09-01: qty 250

## 2014-09-01 MED ORDER — MIDAZOLAM HCL 5 MG/5ML IJ SOLN
INTRAMUSCULAR | Status: AC
  Filled 2014-09-01: qty 5

## 2014-09-01 MED ORDER — SODIUM CHLORIDE 0.9 % IV SOLN: INTRAVENOUS | Status: DC

## 2014-09-01 MED ORDER — VANCOMYCIN HCL 10 G IV SOLR
1250.0000 mg | Freq: Once | INTRAVENOUS | Status: AC
  Administered 2014-09-01: 1250 mg via INTRAVENOUS
  Filled 2014-09-01: qty 1250

## 2014-09-01 MED ORDER — CEFAZOLIN SODIUM-DEXTROSE 2-3 GM-% IV SOLR
2.0000 g | INTRAVENOUS | Status: DC
  Filled 2014-09-01: qty 50

## 2014-09-01 MED ORDER — CETYLPYRIDINIUM CHLORIDE 0.05 % MT LIQD
7.0000 mL | Freq: Two times a day (BID) | OROMUCOSAL | Status: DC
  Administered 2014-09-01: 7 mL via OROMUCOSAL

## 2014-09-01 MED ORDER — INSULIN ASPART 100 UNIT/ML ~~LOC~~ SOLN
0.0000 [IU] | SUBCUTANEOUS | Status: DC
  Administered 2014-09-01: 1 [IU] via SUBCUTANEOUS

## 2014-09-01 MED ORDER — SODIUM CHLORIDE 0.9 % IJ SOLN
3.0000 mL | Freq: Two times a day (BID) | INTRAMUSCULAR | Status: DC
  Administered 2014-09-01: 3 mL via INTRAVENOUS

## 2014-09-01 MED ORDER — DEXTROSE 5 % IV SOLN: 250.0000 mg | INTRAVENOUS | Status: DC

## 2014-09-01 NOTE — Progress Notes (Signed)
I was asked by Dr. Ladona Ridgel to see Barbara Cummings.  She is an ESRD patient from [place] with treatment days of MWF.  She is a poor historian but it appears that she received HD on Friday, then was told on Monday that her AVF did not work.  She then came to the hospital on Tuesday with complaints of weakness- found to have CHB initially with a K of 5.5- she received dialysis early this AM , from 0200 to 0600 without incident thru her AVF and feels OK.  Per cards, they feel she may be discharged soon.  She will need HD again on Friday.  I will look at her tomorrow, if it appears she will be here on Friday, I will arrange for her to get HD here on Friday.  If she is able to leave before Friday, she can go to her OP unit.    Call if any questions   Barbara Cummings A

## 2014-09-01 NOTE — Progress Notes (Signed)
ANTIBIOTIC CONSULT NOTE - INITIAL  Pharmacy Consult for Vancomycin & Cefepime Indication: rule out pneumonia and rule out sepsis  No Known Allergies  Patient Measurements: Height: 5\' 7"  (170.2 cm) Weight: 136 lb 3.9 oz (61.8 kg) IBW/kg (Calculated) : 61.6  Vital Signs: Temp: 97.9 F (36.6 C) (09/09 0153) Temp src: Oral (09/09 0153) BP: 118/42 mmHg (09/09 0948) Pulse Rate: 49 (09/09 0948) Intake/Output from previous day: 09/08 0701 - 09/09 0700 In: -  Out: 3000  Intake/Output from this shift:    Labs:  Recent Labs  08/31/14 2131 09/01/14 0658  WBC 9.0 7.0  HGB 10.2* 10.1*  PLT 177 151  CREATININE 11.16* 4.33*   Estimated Creatinine Clearance: 9.1 ml/min (by C-G formula based on Cr of 4.33). No results found for this basename: VANCOTROUGH, Leodis Binet, VANCORANDOM, GENTTROUGH, GENTPEAK, GENTRANDOM, TOBRATROUGH, TOBRAPEAK, TOBRARND, AMIKACINPEAK, AMIKACINTROU, AMIKACIN,  in the last 72 hours   Microbiology: Recent Results (from the past 720 hour(s))  MRSA PCR SCREENING     Status: None   Collection Time    09/01/14  1:49 AM      Result Value Ref Range Status   MRSA by PCR NEGATIVE  NEGATIVE Final   Comment:            The GeneXpert MRSA Assay (FDA     approved for NASAL specimens     only), is one component of a     comprehensive MRSA colonization     surveillance program. It is not     intended to diagnose MRSA     infection nor to guide or     monitor treatment for     MRSA infections.  CULTURE, BLOOD (ROUTINE X 2)     Status: None   Collection Time    09/01/14  2:08 AM      Result Value Ref Range Status   Specimen Description BLOOD LEFT HAND   Final   Special Requests BOTTLES DRAWN AEROBIC AND ANAEROBIC 4CC EACH   Final   Culture NO GROWTH <24 HRS   Final   Report Status PENDING   Incomplete  CULTURE, BLOOD (ROUTINE X 2)     Status: None   Collection Time    09/01/14  2:08 AM      Result Value Ref Range Status   Specimen Description BLOOD PORT   Final    Special Requests     Final   Value: BOTTLES DRAWN AEROBIC AND ANAEROBIC AEB 6CC ANA 4CC   Culture NO GROWTH <24 HRS   Final   Report Status PENDING   Incomplete    Medical History: Past Medical History  Diagnosis Date  . ESRD on hemodialysis   . Type 2 diabetes mellitus   . Coronary atherosclerosis of native coronary artery     Multivessel s/p CABG 2003  . Chronic anemia     Capsule endoscopy 2007 - gastric erosions, small bowel telangiectasias, one small  . Essential hypertension, benign   . Mixed hyperlipidemia   . History of cardiomyopathy     LVEF 30-35% in 2003    Medications:  Scheduled:  . cinacalcet  30 mg Oral QPC supper  . [START ON 09/03/2014] epoetin alfa  10,000 Units Intravenous Q M,W,F-HD  . heparin subcutaneous  5,000 Units Subcutaneous 3 times per day  . insulin aspart  0-9 Units Subcutaneous 6 times per day  . sevelamer carbonate  1,600 mg Oral TID WC  . simvastatin  40 mg Oral Daily  . sodium  chloride  3 mL Intravenous Q12H  . sodium chloride  3 mL Intravenous Q12H   Assessment: 80 yo F who presented to ED with progressive weakness and difficulty breathing after missing HD x2 due to clotted catheter.   She was hemodynamically unstable on admission and empiric, broad-spectrum antibiotics were started for possible sepsis, HCAP.  She is afebrile with normal WBC.   S/p urgent HD session this morning.  Initial antibiotic doses received over last hour of HD.   Vancomycin 9/9>> Cefepime 9/9>>  Goal of Therapy:  Pre-HD Vancomycin level 15-53mcg/ml  Plan:  Vancomycin  IV qHD Cefepime 2gm IV qHD Check pre-HD Vancomycin level at steady state F/U cx data, labs, and patient progress  Kolbee Bogusz, [facility] 09/01/2014,10:28 AM

## 2014-09-01 NOTE — Progress Notes (Signed)
Patient has critical troponin 0.34 and abnormal EKG with complete heart block. Dr. Felecia Shelling notified. New orders for cardiology consult.

## 2014-09-01 NOTE — Procedures (Signed)
   HEMODIALYSIS TREATMENT NOTE:  3.5 hour emergent heparin-free dialysis completed via right upper arm AVF (15g ante/retrograde).  Goal met:  Tolerated removal of 3 liters without interruption in ultrafiltration.  All blood was reinfused and hemostasis was achieved within 15 minutes.  Report given to Hardin Negus, RN.  Dmitri Pettigrew L. Felishia Wartman, RN, CDN

## 2014-09-01 NOTE — Care Management Note (Signed)
    Page 1 of 1   09/01/2014     10:57:00 AM CARE MANAGEMENT NOTE 09/01/2014  Patient:  Barbara Cummings, Barbara Cummings   Account Number:  [id number]  Date Initiated:  09/01/2014  Documentation initiated by:  Junius Creamer  Subjective/Objective Assessment:   adm w complete heart block     Action/Plan:   lives w da, ptp dr t [place]   Anticipated DC Date:     Anticipated DC Plan:           Choice offered to / List presented to:             Status of service:   Medicare Important Message given?   (If response is "NO", the following Medicare IM given date fields will be blank) Date Medicare IM given:   Medicare IM given by:   Date Additional Medicare IM given:   Additional Medicare IM given by:    Discharge Disposition:    Per UR Regulation:  Reviewed for med. necessity/level of care/duration of stay  If discussed at Long Length of Stay Meetings, dates discussed:    Comments:

## 2014-09-01 NOTE — CV Procedure (Signed)
SURGEON:  Lewayne Bunting, MD     PREPROCEDURE DIAGNOSIS:  Symptomatic Bradycardia due to complete heart block    POSTPROCEDURE DIAGNOSIS:  Same as preprocedure diagnosis     PROCEDURES:   1. Left upper extremity venography.   2. Pacemaker implantation.     INTRODUCTION: Barbara Cummings is a 80 y.o. female  with a history of bradycardia who presents today for pacemaker implantation.  The patient reports intermittent episodes of dizziness over the past few months.  No reversible causes have been identified.  The patient therefore presents today for pacemaker implantation.     DESCRIPTION OF PROCEDURE:  Informed written consent was obtained, and   the patient was brought to the electrophysiology lab in a fasting state.  The patient required no sedation for the procedure today.  The patients left chest was prepped and draped in the usual sterile fashion by the EP lab staff. The skin overlying the left deltopectoral region was infiltrated with lidocaine for local analgesia.  A 4-cm incision was made over the left deltopectoral region.  A left subcutaneous pacemaker pocket was fashioned using a combination of sharp and blunt dissection. Electrocautery was required to assure hemostasis.    Left Upper Extremity Venography: A venogram of the left upper extremity was performed because the patient had previously had an occluded left arm HD fistula, which revealed a moderate left cephalic vein, which emptied into a moderate left subclavian vein.  The left axillary vein was moderate in size.    RA/RV Lead Placement: The left axillary vein was therefore directly visualized and cannulated.  Through the left axillary vein, a [employer] Jude (serial number [id number]) right atrial lead and a [employer] Jude (serial number [id number]) right ventricular lead were advanced with fluoroscopic visualization into the right atrial appendage and right ventricular apical septal positions respectively.  Initial atrial lead P- waves measured 1.5  mV with impedance of 374 ohms and a threshold of 1.4 V at 0.5 msec.  Right ventricular lead R-waves measured 14 mV with an impedance of 600 ohms and a threshold of 1 V at 0.5 msec.  Both leads were secured to the pectoralis fascia using #2-0 silk over the suture sleeves.   Device Placement:  The leads were then connected to a St. Jude (serial number [id number]) pacemaker.  The pocket was irrigated with copious gentamicin solution.  The pacemaker was then placed into the pocket.  The pocket was then closed in 2 layers with 2.0 Vicryl suture for the subcutaneous and subcuticular layers.  Steri-Strips and a sterile dressing were then applied.  There were no early apparent complications.     CONCLUSIONS:   1. Successful implantation of a St. Jude dual-chamber pacemaker for symptomatic bradycardia due to complete heart block  2. No early apparent complications.           Lewayne Bunting, MD 09/01/2014 4:23 PM

## 2014-09-01 NOTE — H&P (Addendum)
Hospitalist Admission History and Physical  Patient name: Barbara Cummings Medical record number: [id number] Date of birth: [birth year] Age: 80 y.o. Gender: female  Primary Care Provider: Avon Gully, MD  Chief Complaint: weakenss, anasarca, ESRD,  HCAP/sepsis   History of Present Illness:This is a 80 y.o. year old female with significant past medical history of ESRD on HD MWF,  HTN, DM, HLD, CAD s/p CABG, anemia  presenting with weakness, anasarca, HCAP/sepsis, hyperkalemia, heart block .  Family states the patient was unable to go to dialysis on Friday because of dialysis site issues. Was supposed to have been scheduled for follow dialysis yesterday however this was also unable to be done. Per the family, patient's had progressive weakness over the past 24 hours. No report of fevers or chills. No episodes of vomiting and nausea. Positive decreased appetite. Patient currently denies any chest pain. Presented to the ER today because of worsening symptoms. Note with a temperature of 96, heart rate in the 30s to 50s. Blood pressure in the 160s to 180s. Satting 86% on room air, but mainly in the mid 90s. White blood cell count 9, hemoglobin 10.2, potassium 5.5, creatinine 1.16, BUN 90, bicarbonate 15, calcium 7.6, glucose 332. Annia Friendly shows pulmonary edema with bibasilar opacities question of atelectasis versus infiltrate. Nephrology consult with recommendation for emergent dialysis. Patient is planned for dialysis of stairs with the next one to 2 hours. Patient is receiving albuterol, calcium as well as D50 and insulin. EKG also noted with concerning possible complete heart block. Has been given atropine times one. No history of first degree heart block with EKG 2007. Currently asymptomatic. No chest pain.  Assessment and Plan: Barbara Cummings is a 80 y.o. year old female presenting with weakness, anasarca, healthcare associated pneumonia/sepsis, heart block  Active Problems:   Weakness   Anasarca    ESRD (end stage renal disease) on dialysis   HCAP (healthcare-associated pneumonia)   Heart block   Sepsis   1-Weakness -Likely multifactorial in the setting of anasarca, Healthcare associated pneumonia/sepsis, end-stage renal disease -Emergency dialysis per renal recommendations -Vancomycin and cefepime per pharmacy -Pan culture. Urine strep and Legionella.  2-ESRD  -emergent dialysis per renal  -noted metabolic acidosis and volume overload on presentation  -fu renal recs  3-Sepsis/HCAP  -hypothermic on admission -panculture -vanc and cefepime per pharmacy  -stepdown bed.  -hemodynamically stable now pending HD  -Continue to follow    4-Heart block  -? 3rd deg heart block on EKG -? Secondary to volume overload in setting of ESRD and mild hyperkalemia  -noted hx/o 1st deg heart block from 2007 EKG -repeat EKG pending s/p hyperkalemia treatment  -s/p atropine x 1 -telebed in stepdown -consider transcutaneous pacing vs. Transfer for cards eval if pt becomes unstable/symptomatic    5-hyperkalemia -mild on presentation -EKG changes noted as above -s/p insulin, calcium, IV bicarb -pending HD  -f/u pending HD   6-DM  -SSI, A1C   7-Anemia -hgb stable  FEN/GI: NPO  Prophylaxis: sub q heparin  Disposition: pending further evaluation  Code Status:Full Code    Patient Active Problem List   Diagnosis Date Noted  . Weakness 09/01/2014   Past Medical History: Past Medical History  Diagnosis Date  . Renal disorder     Past Surgical History: History reviewed. No pertinent past surgical history.  Social History: History   Social History  . Marital Status: Single    Spouse Name: N/A    Number of Children: N/A  . Years of Education: N/A  Social History Main Topics  . Smoking status: None  . Smokeless tobacco: None  . Alcohol Use: None  . Drug Use: None  . Sexual Activity: None   Other Topics Concern  . None   Social History Narrative  . None     Family History: No family history on file.  Allergies: No Known Allergies  Current Facility-Administered Medications  Medication Dose Route Frequency Provider Last Rate Last Dose  . 0.9 %  sodium chloride infusion  1,000 mL Intravenous Continuous Linwood Dibbles, MD 15 mL/hr at 08/31/14 2205 1,000 mL at 08/31/14 2205  . 0.9 %  sodium chloride infusion  100 mL Intravenous PRN Jamse Mead, MD      . 0.9 %  sodium chloride infusion  100 mL Intravenous PRN Jamse Mead, MD      . 0.9 %  sodium chloride infusion  250 mL Intravenous PRN Doree Albee, MD      . calcium chloride 10 % injection           . cinacalcet (SENSIPAR) tablet 30 mg  30 mg Oral QPC supper Doree Albee, MD      . epoetin alfa (EPOGEN,PROCRIT) injection 10,000 Units  10,000 Units Intravenous Once per day on Mon Wed Fri Jamse Mead, MD      . feeding supplement (NEPRO CARB STEADY) liquid 237 mL  237 mL Oral PRN Jamse Mead, MD      . heparin injection 1,000 Units  1,000 Units Dialysis PRN Jamse Mead, MD      . heparin injection 5,000 Units  5,000 Units Subcutaneous 3 times per day Doree Albee, MD      . insulin aspart (novoLOG) injection 0-9 Units  0-9 Units Subcutaneous 6 times per day Doree Albee, MD      . lidocaine (PF) (XYLOCAINE) 1 % injection 5 mL  5 mL Intradermal PRN Jamse Mead, MD      . lidocaine-prilocaine (EMLA) cream 1 application  1 application Topical PRN Jamse Mead, MD      . pentafluoroprop-tetrafluoroeth (GEBAUERS) aerosol 1 application  1 application Topical PRN Jamse Mead, MD      . sevelamer carbonate (RENVELA) tablet 1,600 mg  1,600 mg Oral TID WC Doree Albee, MD      . simvastatin (ZOCOR) tablet 40 mg  40 mg Oral Daily Doree Albee, MD      . sodium chloride 0.9 % injection 3 mL  3 mL Intravenous Q12H Doree Albee, MD      . sodium chloride 0.9 % injection 3 mL  3 mL Intravenous Q12H Doree Albee, MD      . sodium chloride 0.9  % injection 3 mL  3 mL Intravenous PRN Doree Albee, MD       Current Outpatient Prescriptions  Medication Sig Dispense Refill  . cinacalcet (SENSIPAR) 30 MG tablet Take 30 mg by mouth daily after supper.       Marland Kitchen glipiZIDE (GLUCOTROL) 5 MG tablet Take by mouth daily before breakfast.      . sevelamer carbonate (RENVELA) 800 MG tablet Take 1,600 mg by mouth 3 (three) times daily with meals.      . simvastatin (ZOCOR) 40 MG tablet Take 40 mg by mouth daily.       Review Of Systems: 12 point ROS negative except as noted above in HPI.  Physical Exam: Filed Vitals:   09/01/14 0015  BP: 178/54  Pulse: 52  Temp:  Resp: 16    General: cooperative and mild distress HEENT: PERRLA and extra ocular movement intact Heart: S1, S2 normal, no murmur, rub or gallop, regular rate and rhythm Lungs: clear to auscultation Abdomen: abdomen is soft without significant tenderness, masses, organomegaly or guarding Extremities: extremities normal, atraumatic, no cyanosis or edema Skin:no rashes, no ecchymoses Neurology: normal without focal findings  Labs and Imaging: Lab Results  Component Value Date/Time   NA 138 08/31/2014  9:31 PM   K 5.5* 08/31/2014  9:31 PM   CL 98 08/31/2014  9:31 PM   CO2 15* 08/31/2014  9:31 PM   BUN 90* 08/31/2014  9:31 PM   CREATININE 11.16* 08/31/2014  9:31 PM   GLUCOSE 332* 08/31/2014  9:31 PM   Lab Results  Component Value Date   WBC 9.0 08/31/2014   HGB 10.2* 08/31/2014   HCT 31.2* 08/31/2014   MCV 93.7 08/31/2014   PLT 177 08/31/2014    Dg Chest Portable 1 View  08/31/2014   CLINICAL DATA:  Shortness of breath and weakness. History of end-stage renal disease.  EXAM: PORTABLE CHEST - 1 VIEW  COMPARISON:  08/18/2007  FINDINGS: Grossly unchanged enlarged cardiac silhouette and mediastinal contours post median sternotomy and CABG. Atherosclerotic plaque within the thoracic aorta. The pulmonary vasculature is indistinct with cephalization of flow. Worsening bilateral infrahilar and left  basilar heterogeneous/consolidative opacities. Trace bilateral effusions are not excluded. No pneumothorax. Unchanged bones.  IMPRESSION: Findings compatible with pulmonary edema and bibasilar opacities, left greater than right, atelectasis versus infiltrate. A follow-up chest radiograph in 4 to 6 weeks after treatment is recommended to ensure resolution.   Electronically Signed   By: Simonne Come M.D.   On: 08/31/2014 22:05           Doree Albee MD  Pager: [phone]

## 2014-09-01 NOTE — Consult Note (Signed)
Reason for Consult:complete heart block Referring Physician: Dr. [name] is an 80 y.o. female.   HPI: the patient is an 80 yo woman with a h/o ESRD on HD, CAD, s/p CABG, who presented with symptomatic bradycardia and found to have CHB. She was on no meds to cause bradycardia and had normal electrolytes after HD. She is transferred for PPM. She has been dialyzed on the right side for years. Prior to that she dialyzed on the left. She has not had syncope. She does have LV dysfunction. She has class 2 CHF.  PMH: Past Medical History  Diagnosis Date  . ESRD on hemodialysis   . Type 2 diabetes mellitus   . Coronary atherosclerosis of native coronary artery     Multivessel s/p CABG 2003  . Chronic anemia     Capsule endoscopy 2007 - gastric erosions, small bowel telangiectasias, one small  . Essential hypertension, benign   . Mixed hyperlipidemia   . History of cardiomyopathy     LVEF 30-35% in 2003    PSHX: Past Surgical History  Procedure Laterality Date  . Coronary artery bypass graft  2003    Dr. Laneta Simmers - LIMA to LAD, SVG to OM1 and OM2, SVG to PDA  . Left arm av fistula  2003  . Eye surgery  2004  . Perirectal surgery  2004  . Eye surgery  2005  . Left arm av fistula thrombectomy  2007  . Left arm av fistula thrombectomy  2008    FAMHX: Family History  Problem Relation Age of Onset  . Family history unknown: Yes    Social History:  reports that she has quit smoking. Her smoking use included Cigarettes. She smoked 0.00 packs per day. She does not have any smokeless tobacco history on file. She reports that she does not drink alcohol or use illicit drugs.  Allergies: No Known Allergies  Medications: reviewed  Dg Chest Portable 1 View  08/31/2014   CLINICAL DATA:  Shortness of breath and weakness. History of end-stage renal disease.  EXAM: PORTABLE CHEST - 1 VIEW  COMPARISON:  08/18/2007  FINDINGS: Grossly unchanged enlarged cardiac silhouette and  mediastinal contours post median sternotomy and CABG. Atherosclerotic plaque within the thoracic aorta. The pulmonary vasculature is indistinct with cephalization of flow. Worsening bilateral infrahilar and left basilar heterogeneous/consolidative opacities. Trace bilateral effusions are not excluded. No pneumothorax. Unchanged bones.  IMPRESSION: Findings compatible with pulmonary edema and bibasilar opacities, left greater than right, atelectasis versus infiltrate. A follow-up chest radiograph in 4 to 6 weeks after treatment is recommended to ensure resolution.   Electronically Signed   By: Simonne Come M.D.   On: 08/31/2014 22:05    ROS  As stated in the HPI and negative for all other systems.  Physical Exam  Vitals:Blood pressure 82/62, pulse 39, temperature 98.1 F (36.7 C), temperature source Oral, resp. rate 16, height 5\' 7"  (1.702 m), weight 136 lb 3.9 oz (61.8 kg), SpO2 96.00%.  Chronically ill appearing elderly woman, NAD HEENT: Unremarkable Neck:  No JVD, no thyromegally Back:  No CVA tenderness Lungs:  Clear with no wheezes HEART:  Regular brady rhythm, 2/6 systolic murmurs, no rubs, no clicks Abd:  soft, positive bowel sounds, no organomegally, no rebound, no guarding Ext:  2 plus pulses, no edema, no cyanosis, no clubbing Skin:  No rashes no nodules Neuro:  CN II through XII intact, motor grossly intact  Assessment/Plan: 1. Complete heart block with no reversible cause 2. ESRD  on HD 3. CAD with LV dysfunction 4. Longstanding HTN Rec: I have discussed the treatment options with the patient and her family. The risks/benefits/goals/expectations of PPM insertion have been discussed with the patient and she wishes to proceed.   Barbara Gowda TaylorMD 09/01/2014, 1:57 PM

## 2014-09-01 NOTE — Consult Note (Signed)
Reason for Consult: Complete heart block Referring Physician: PTI Consulting cardiologist: Dr. Satira Sark  Barbara Cummings is an 80 y.o. female patient who has history of multivessel CAD s/p CABG in 2003 but has not been followed by cardiology since then based on chart review. Prior LVEF 30-35% in 2003. She also has end-stage renal disease on hemodialysis. She presented yesterday after missing dialysis due to difficultly with right arm AVF access. She was apparently to go to [place] today to have the access evaluated. She came in with weakness and dyspnea, excess volume, hyperkalemia with potassium of 5.5 and 2:1 as well as complete heart block. First 2 Troponins negative, third one is 0.34. She did undergo a 3-1/2 hour emergent, heparin-free dialysis session with use of the right arm AVF. 3 L removed. Potassium today is 3.7.  Patient's ECG today shows CHB in 40's. Telemetry shows heart rate up into the 50s, but still with 2:1 and complete heart block. She reports no chest pain and actually appears fairly comfortable.  She denies any recent chest pain, dyspnea on exertion, dizziness or presyncope. She lives with her daughter and hasn't had any obvious cardiac problems since CABG.    Past Medical History  Diagnosis Date  . ESRD on hemodialysis   . Type 2 diabetes mellitus   . Coronary atherosclerosis of native coronary artery     Multivessel s/p CABG 2003  . Chronic anemia     Capsule endoscopy 2007 - gastric erosions, small bowel telangiectasias, one small  . Essential hypertension, benign   . Mixed hyperlipidemia   . History of cardiomyopathy     LVEF 30-35% in 2003    Past Surgical History  Procedure Laterality Date  . Coronary artery bypass graft  2003    Dr. Cyndia Bent - LIMA to LAD, SVG to OM1 and OM2, SVG to PDA  . Left arm av fistula  2003  . Eye surgery  2004  . Perirectal surgery  2004  . Eye surgery  2005  . Left arm av fistula thrombectomy  2007  . Left arm av  fistula thrombectomy  2008    Family History  Problem Relation Age of Onset  . Family history unknown: Yes    Social History:  reports that she has quit smoking. Her smoking use included Cigarettes. She smoked 0.00 packs per day. She does not have any smokeless tobacco history on file. She reports that she does not drink alcohol or use illicit drugs.  Allergies: No Known Allergies  Medications: . calcium chloride      . cinacalcet  30 mg Oral QPC supper  . [START ON 09/03/2014] epoetin alfa  10,000 Units Intravenous Q M,W,F-HD  . heparin subcutaneous  5,000 Units Subcutaneous 3 times per day  . insulin aspart  0-9 Units Subcutaneous 6 times per day  . sevelamer carbonate  1,600 mg Oral TID WC  . simvastatin  40 mg Oral Daily  . sodium chloride  3 mL Intravenous Q12H  . sodium chloride  3 mL Intravenous Q12H     Results for orders placed during the hospital encounter of 08/31/14 (from the past 48 hour(s))  APTT     Status: None   Collection Time    08/31/14  9:31 PM      Result Value Ref Range   aPTT 33  24 - 37 seconds  CBC     Status: Abnormal   Collection Time    08/31/14  9:31 PM  Result Value Ref Range   WBC 9.0  4.0 - 10.5 K/uL   RBC 3.33 (*) 3.87 - 5.11 MIL/uL   Hemoglobin 10.2 (*) 12.0 - 15.0 g/dL   HCT 31.2 (*) 36.0 - 46.0 %   MCV 93.7  78.0 - 100.0 fL   MCH 30.6  26.0 - 34.0 pg   MCHC 32.7  30.0 - 36.0 g/dL   RDW 14.8  11.5 - 15.5 %   Platelets 177  150 - 400 K/uL  COMPREHENSIVE METABOLIC PANEL     Status: Abnormal   Collection Time    08/31/14  9:31 PM      Result Value Ref Range   Sodium 138  137 - 147 mEq/L   Potassium 5.5 (*) 3.7 - 5.3 mEq/L   Chloride 98  96 - 112 mEq/L   CO2 15 (*) 19 - 32 mEq/L   Glucose, Bld 332 (*) 70 - 99 mg/dL   BUN 90 (*) 6 - 23 mg/dL   Creatinine, Ser 11.16 (*) 0.50 - 1.10 mg/dL   Calcium 7.6 (*) 8.4 - 10.5 mg/dL   Total Protein 6.9  6.0 - 8.3 g/dL   Albumin 3.2 (*) 3.5 - 5.2 g/dL   AST 33  0 - 37 U/L   ALT 27  0 -  35 U/L   Alkaline Phosphatase 221 (*) 39 - 117 U/L   Total Bilirubin 0.6  0.3 - 1.2 mg/dL   GFR calc non Af Amer 3 (*) >90 mL/min   GFR calc Af Amer 3 (*) >90 mL/min   Comment: (NOTE)     The eGFR has been calculated using the CKD EPI equation.     This calculation has not been validated in all clinical situations.     eGFR's persistently <90 mL/min signify possible Chronic Kidney     Disease.   Anion gap 25 (*) 5 - 15  PROTIME-INR     Status: Abnormal   Collection Time    08/31/14  9:31 PM      Result Value Ref Range   Prothrombin Time 16.5 (*) 11.6 - 15.2 seconds   INR 1.33  0.00 - 1.49  TROPONIN I     Status: None   Collection Time    08/31/14  9:31 PM      Result Value Ref Range   Troponin I <0.30  <0.30 ng/mL   Comment:            Due to the release kinetics of cTnI,     a negative result within the first hours     of the onset of symptoms does not rule out     myocardial infarction with certainty.     If myocardial infarction is still suspected,     repeat the test at appropriate intervals.  TROPONIN I     Status: None   Collection Time    09/01/14 12:49 AM      Result Value Ref Range   Troponin I <0.30  <0.30 ng/mL   Comment:            Due to the release kinetics of cTnI,     a negative result within the first hours     of the onset of symptoms does not rule out     myocardial infarction with certainty.     If myocardial infarction is still suspected,     repeat the test at appropriate intervals.  MRSA PCR SCREENING     Status:  None   Collection Time    09/01/14  1:49 AM      Result Value Ref Range   MRSA by PCR NEGATIVE  NEGATIVE   Comment:            The GeneXpert MRSA Assay (FDA     approved for NASAL specimens     only), is one component of a     comprehensive MRSA colonization     surveillance program. It is not     intended to diagnose MRSA     infection nor to guide or     monitor treatment for     MRSA infections.  CULTURE, BLOOD (ROUTINE X 2)      Status: None   Collection Time    09/01/14  2:08 AM      Result Value Ref Range   Specimen Description BLOOD LEFT HAND     Special Requests BOTTLES DRAWN AEROBIC AND ANAEROBIC 4CC EACH     Culture PENDING     Report Status PENDING    CULTURE, BLOOD (ROUTINE X 2)     Status: None   Collection Time    09/01/14  2:08 AM      Result Value Ref Range   Specimen Description BLOOD PORT     Special Requests       Value: BOTTLES DRAWN AEROBIC AND ANAEROBIC AEB 6CC ANA 4CC   Culture PENDING     Report Status PENDING    COMPREHENSIVE METABOLIC PANEL     Status: Abnormal   Collection Time    09/01/14  6:58 AM      Result Value Ref Range   Sodium 143  137 - 147 mEq/L   Potassium 3.7  3.7 - 5.3 mEq/L   Comment: DELTA CHECK NOTED   Chloride 100  96 - 112 mEq/L   CO2 26  19 - 32 mEq/L   Glucose, Bld 95  70 - 99 mg/dL   BUN 24 (*) 6 - 23 mg/dL   Creatinine, Ser 4.33 (*) 0.50 - 1.10 mg/dL   Calcium 9.9  8.4 - 10.5 mg/dL   Total Protein 7.3  6.0 - 8.3 g/dL   Albumin 3.2 (*) 3.5 - 5.2 g/dL   AST 32  0 - 37 U/L   ALT 26  0 - 35 U/L   Alkaline Phosphatase 213 (*) 39 - 117 U/L   Total Bilirubin 0.7  0.3 - 1.2 mg/dL   GFR calc non Af Amer 8 (*) >90 mL/min   GFR calc Af Amer 10 (*) >90 mL/min   Comment: (NOTE)     The eGFR has been calculated using the CKD EPI equation.     This calculation has not been validated in all clinical situations.     eGFR's persistently <90 mL/min signify possible Chronic Kidney     Disease.   Anion gap 17 (*) 5 - 15  CBC WITH DIFFERENTIAL     Status: Abnormal   Collection Time    09/01/14  6:58 AM      Result Value Ref Range   WBC 7.0  4.0 - 10.5 K/uL   RBC 3.27 (*) 3.87 - 5.11 MIL/uL   Hemoglobin 10.1 (*) 12.0 - 15.0 g/dL   HCT 30.0 (*) 36.0 - 46.0 %   MCV 91.7  78.0 - 100.0 fL   MCH 30.9  26.0 - 34.0 pg   MCHC 33.7  30.0 - 36.0 g/dL   RDW 14.6  11.5 - 15.5 %  Platelets 151  150 - 400 K/uL   Neutrophils Relative % 76  43 - 77 %   Neutro Abs 5.3  1.7 -  7.7 K/uL   Lymphocytes Relative 16  12 - 46 %   Lymphs Abs 1.1  0.7 - 4.0 K/uL   Monocytes Relative 8  3 - 12 %   Monocytes Absolute 0.5  0.1 - 1.0 K/uL   Eosinophils Relative 0  0 - 5 %   Eosinophils Absolute 0.0  0.0 - 0.7 K/uL   Basophils Relative 0  0 - 1 %   Basophils Absolute 0.0  0.0 - 0.1 K/uL  TROPONIN I     Status: Abnormal   Collection Time    09/01/14  6:58 AM      Result Value Ref Range   Troponin I 0.34 (*) <0.30 ng/mL   Comment: CRITICAL RESULT CALLED TO, READ BACK BY AND VERIFIED WITH:     KNIGHT,C AT 7:45AM ON 09/01/14 BY FESTERMAN,C                Due to the release kinetics of cTnI,     a negative result within the first hours     of the onset of symptoms does not rule out     myocardial infarction with certainty.     If myocardial infarction is still suspected,     repeat the test at appropriate intervals.  GLUCOSE, CAPILLARY     Status: Abnormal   Collection Time    09/01/14  7:45 AM      Result Value Ref Range   Glucose-Capillary 102 (*) 70 - 99 mg/dL   Comment 1 Notify RN      Dg Chest Portable 1 View  08/31/2014   CLINICAL DATA:  Shortness of breath and weakness. History of end-stage renal disease.  EXAM: PORTABLE CHEST - 1 VIEW  COMPARISON:  08/18/2007  FINDINGS: Grossly unchanged enlarged cardiac silhouette and mediastinal contours post median sternotomy and CABG. Atherosclerotic plaque within the thoracic aorta. The pulmonary vasculature is indistinct with cephalization of flow. Worsening bilateral infrahilar and left basilar heterogeneous/consolidative opacities. Trace bilateral effusions are not excluded. No pneumothorax. Unchanged bones.  IMPRESSION: Findings compatible with pulmonary edema and bibasilar opacities, left greater than right, atelectasis versus infiltrate. A follow-up chest radiograph in 4 to 6 weeks after treatment is recommended to ensure resolution.   Electronically Signed   By: Sandi Mariscal M.D.   On: 08/31/2014 22:05    ROS See HPI.  Stable appetite. No bleeding problems. No fevers or chills. Other systems reviewed and negative except as outlined.  Blood pressure 118/42, pulse 49, temperature 97.9 F (36.6 C), temperature source Oral, resp. rate 17, height $RemoveBe'5\' 7"'CazHOYQpB$  (1.702 m), weight 136 lb 3.9 oz (61.8 kg), SpO2 95.00%.  PHYSICAL EXAM: Normally-nournished, elderly woman in no acute distress. Neck: No JVD, HJR, Bruit, or thyroid enlargement Lungs: Decreased Breath sounds. Few crackles at bases No tachypnea, without wheezing, rales, or rhonchi Cardiovascular: RRR, PMI not JHERDEYCX,4/4 systolic murmur LSB and apex.No gallops, bruit, thrill, or heave. Abdomen: BS normal. Soft without organomegaly, masses, lesions or tenderness. Extremities: without cyanosis, clubbing or edema. Good distal pulses  bilateral SKin: Warm, no lesions or rashes  Musculoskeletal: No deformities Neuro: no focal signs   Assessment/Plan:  1. Complete heart block. Initially hyperkalemic having missed dialysis, however remains in CHB with normal K. Transfer to [facility] for further treatment and possible pacemaker.  2. Coronary artery disease status post CABG in 2003.  First 2 troponins are negative third troponin is 0.34. Likely demand ischemia. Check 2D echocardiogram for reassessment of LVEF.  3. ESRD on HD. Patient will need to have vascular evaluation of her right arm AVF to ensure that dialysis access is adequate.  4. Patient somewhat hypothermic at presentation, was placed on broad-spectrum antibiotics by admitting physician with cultures sent. No clear evidence of active infection as yet however.  Ermalinda Barrios, PA-C 09/01/2014, 9:54 AM    Attending note:  Consulted to see the patient this morning. Patient seen and examined. Reviewed records and modified above note by Ms. Bonnell Public PA-C. Ms. Gillooly presented with weakness and shortness of breath, having missed recent hemodialysis secondary to access problems with her right arm AV fistula. She was noted  to be mildly hypothermic at presentation, heart rates in the 40s to 50s with evidence of both 2:1 and complete heart block by subsequent monitoring and ECG. Wide-complex rhythm noted. Initial cardiac markers negative subsequent troponin I 0.34, likely reflecting demand ischemia. She was hyperkalemic at 5.5 initially, however with urgent, 3-1/2 hour hemodialysis session and removal of 3 L, potassium subsequently 3.7. She remains however with bradycardia and heart block. No other offending medications. Cardiac history includes multivessel CAD status post CABG in 2003 with LVEF approximately 35% at that time. She has had no subsequent cardiac followup. She appears relatively comfortable, no complaints of chest pain, overall hemodynamically stable with some transient hypotension noted. Dopamine was ordered to be started, however held off with stable blood pressure, most recently 118/50 at bedside check with [facility] present. Case discussed with Dr. Theador Hawthorne with nephrology, Dr. Legrand Rams had already rounded and left, no family present now (nursing contacting). Recommendation is transfer to the ICU at [facility] for EP evaluation regarding pacemaker. Patient needs an echocardiogram to reassess LVEF. Would cycle additional cardiac markers although do not suspect ACS at this time. Patient will need to have vascular evaluation of her right arm AV fistula to make sure that hemodialysis access is adequate. History indicates prior problems with graft clotting.  Satira Sark, M.D., F.A.C.C.

## 2014-09-01 NOTE — Progress Notes (Signed)
ANTIBIOTIC CONSULT NOTE-Preliminary  Pharmacy Consult for Cefepime and Vancomycin Indication:Pneumonia/sepis   No Known Allergies  Patient Measurements:   Adjusted Body Weight:  Vital Signs: Temp: 96 F (35.6 C) (09/08 2348) Temp src: Axillary (09/08 2348) BP: 179/51 mmHg (09/09 0045) Pulse Rate: 50 (09/09 0045)  Labs:  Recent Labs  08/31/14 2131  WBC 9.0  HGB 10.2*  PLT 177  CREATININE 11.16*    CrCl is unknown because there is no height on file for the current visit.  No results found for this basename: VANCOTROUGH, VANCOPEAK, VANCORANDOM, GENTTROUGH, GENTPEAK, GENTRANDOM, TOBRATROUGH, TOBRAPEAK, TOBRARND, AMIKACINPEAK, AMIKACINTROU, AMIKACIN,  in the last 72 hours   Microbiology: No results found for this or any previous visit (from the past 720 hour(s)).  Medical History: Past Medical History  Diagnosis Date  . Renal disorder   . Diabetes mellitus without complication     Medications:  Ceftriaxone 1 Gm IV in the ED Azithromycin 250 mg IV in the ED  Assessment: 80 yo female with ESRD on dialysis seen in the ED with weakness and signs of sepsis. Pt did not receive her scheduled dialysis on Friday 9/4 due to clotted fistula. Pt to receive dialysis as soon as possible. Cefepime and Vancomycin to be started empirically for HCAP.  Goal of Therapy:  Eradication of infection Vancomycin target pre and post hemodialysis levels per protocol    Plan:  Preliminary review of pertinent patient information completed.  Protocol will be initiated with one-time doses of Cefepime 2 Gm IV and Vancomycin 1250 mg IV to be given after current dialysis session.  Jeani Hawking clinical pharmacist will complete review during morning rounds to assess patient and finalize treatment regimen.  Arelia Sneddon, [facility] 09/01/2014,1:52 AM

## 2014-09-01 NOTE — Progress Notes (Signed)
Patient with orders to be transported to [facility] hospital. Report given to [facility]. Daughter, Jeremy Johann notified. Patient stable. Patient left via [facility].

## 2014-09-01 NOTE — Progress Notes (Signed)
Subjective: Patient was admitted due to shortness of breath and generalized weakness. She missed her dialysis. She is being treated for possible sepsis/HCAP. She has abnormal EKG ?  3rd degree heart block. No chest pain, nausea or vomiting.  Objective: Vital signs in last 24 hours: Temp:  [96 F (35.6 C)-97.9 F (36.6 C)] 97.9 F (36.6 C) (09/09 0153) Pulse Rate:  [39-53] 49 (09/09 0600) Resp:  [15-32] 17 (09/09 0600) BP: (96-189)/(33-155) 112/33 mmHg (09/09 0600) SpO2:  [86 %-100 %] 94 % (09/09 0600) Weight:  [61.8 kg (136 lb 3.9 oz)] 61.8 kg (136 lb 3.9 oz) (09/09 0600) Weight change:  Last BM Date: 08/31/14  Intake/Output from previous day: 09/08 0701 - 09/09 0700 In: -  Out: 3000   PHYSICAL EXAM General appearance: cachectic and no distress Resp: diminished breath sounds bilaterally and rhonchi bilaterally Cardio: S1 and S2 heard bradycardic GI: soft, non-tender; bowel sounds normal; no masses,  no organomegaly Extremities: extremities normal, atraumatic, no cyanosis or edema  Lab Results:  Results for orders placed during the hospital encounter of 08/31/14 (from the past 48 hour(s))  APTT     Status: None   Collection Time    08/31/14  9:31 PM      Result Value Ref Range   aPTT 33  24 - 37 seconds  CBC     Status: Abnormal   Collection Time    08/31/14  9:31 PM      Result Value Ref Range   WBC 9.0  4.0 - 10.5 K/uL   RBC 3.33 (*) 3.87 - 5.11 MIL/uL   Hemoglobin 10.2 (*) 12.0 - 15.0 g/dL   HCT 31.2 (*) 36.0 - 46.0 %   MCV 93.7  78.0 - 100.0 fL   MCH 30.6  26.0 - 34.0 pg   MCHC 32.7  30.0 - 36.0 g/dL   RDW 14.8  11.5 - 15.5 %   Platelets 177  150 - 400 K/uL  COMPREHENSIVE METABOLIC PANEL     Status: Abnormal   Collection Time    08/31/14  9:31 PM      Result Value Ref Range   Sodium 138  137 - 147 mEq/L   Potassium 5.5 (*) 3.7 - 5.3 mEq/L   Chloride 98  96 - 112 mEq/L   CO2 15 (*) 19 - 32 mEq/L   Glucose, Bld 332 (*) 70 - 99 mg/dL   BUN 90 (*) 6 - 23  mg/dL   Creatinine, Ser 11.16 (*) 0.50 - 1.10 mg/dL   Calcium 7.6 (*) 8.4 - 10.5 mg/dL   Total Protein 6.9  6.0 - 8.3 g/dL   Albumin 3.2 (*) 3.5 - 5.2 g/dL   AST 33  0 - 37 U/L   ALT 27  0 - 35 U/L   Alkaline Phosphatase 221 (*) 39 - 117 U/L   Total Bilirubin 0.6  0.3 - 1.2 mg/dL   GFR calc non Af Amer 3 (*) >90 mL/min   GFR calc Af Amer 3 (*) >90 mL/min   Comment: (NOTE)     The eGFR has been calculated using the CKD EPI equation.     This calculation has not been validated in all clinical situations.     eGFR's persistently <90 mL/min signify possible Chronic Kidney     Disease.   Anion gap 25 (*) 5 - 15  PROTIME-INR     Status: Abnormal   Collection Time    08/31/14  9:31 PM  Result Value Ref Range   Prothrombin Time 16.5 (*) 11.6 - 15.2 seconds   INR 1.33  0.00 - 1.49  TROPONIN I     Status: None   Collection Time    08/31/14  9:31 PM      Result Value Ref Range   Troponin I <0.30  <0.30 ng/mL   Comment:            Due to the release kinetics of cTnI,     a negative result within the first hours     of the onset of symptoms does not rule out     myocardial infarction with certainty.     If myocardial infarction is still suspected,     repeat the test at appropriate intervals.  TROPONIN I     Status: None   Collection Time    09/01/14 12:49 AM      Result Value Ref Range   Troponin I <0.30  <0.30 ng/mL   Comment:            Due to the release kinetics of cTnI,     a negative result within the first hours     of the onset of symptoms does not rule out     myocardial infarction with certainty.     If myocardial infarction is still suspected,     repeat the test at appropriate intervals.  MRSA PCR SCREENING     Status: None   Collection Time    09/01/14  1:49 AM      Result Value Ref Range   MRSA by PCR NEGATIVE  NEGATIVE   Comment:            The GeneXpert MRSA Assay (FDA     approved for NASAL specimens     only), is one component of a     comprehensive  MRSA colonization     surveillance program. It is not     intended to diagnose MRSA     infection nor to guide or     monitor treatment for     MRSA infections.  CULTURE, BLOOD (ROUTINE X 2)     Status: None   Collection Time    09/01/14  2:08 AM      Result Value Ref Range   Specimen Description BLOOD LEFT HAND     Special Requests BOTTLES DRAWN AEROBIC AND ANAEROBIC 4CC EACH     Culture PENDING     Report Status PENDING    CULTURE, BLOOD (ROUTINE X 2)     Status: None   Collection Time    09/01/14  2:08 AM      Result Value Ref Range   Specimen Description BLOOD PORT     Special Requests       Value: BOTTLES DRAWN AEROBIC AND ANAEROBIC AEB 6CC ANA 4CC   Culture PENDING     Report Status PENDING    COMPREHENSIVE METABOLIC PANEL     Status: Abnormal   Collection Time    09/01/14  6:58 AM      Result Value Ref Range   Sodium 143  137 - 147 mEq/L   Potassium 3.7  3.7 - 5.3 mEq/L   Comment: DELTA CHECK NOTED   Chloride 100  96 - 112 mEq/L   CO2 26  19 - 32 mEq/L   Glucose, Bld 95  70 - 99 mg/dL   BUN 24 (*) 6 - 23 mg/dL   Creatinine, Ser 4.33 (*)  0.50 - 1.10 mg/dL   Calcium 9.9  8.4 - 10.5 mg/dL   Total Protein 7.3  6.0 - 8.3 g/dL   Albumin 3.2 (*) 3.5 - 5.2 g/dL   AST 32  0 - 37 U/L   ALT 26  0 - 35 U/L   Alkaline Phosphatase 213 (*) 39 - 117 U/L   Total Bilirubin 0.7  0.3 - 1.2 mg/dL   GFR calc non Af Amer 8 (*) >90 mL/min   GFR calc Af Amer 10 (*) >90 mL/min   Comment: (NOTE)     The eGFR has been calculated using the CKD EPI equation.     This calculation has not been validated in all clinical situations.     eGFR's persistently <90 mL/min signify possible Chronic Kidney     Disease.   Anion gap 17 (*) 5 - 15  CBC WITH DIFFERENTIAL     Status: Abnormal   Collection Time    09/01/14  6:58 AM      Result Value Ref Range   WBC 7.0  4.0 - 10.5 K/uL   RBC 3.27 (*) 3.87 - 5.11 MIL/uL   Hemoglobin 10.1 (*) 12.0 - 15.0 g/dL   HCT 30.0 (*) 36.0 - 46.0 %   MCV 91.7   78.0 - 100.0 fL   MCH 30.9  26.0 - 34.0 pg   MCHC 33.7  30.0 - 36.0 g/dL   RDW 14.6  11.5 - 15.5 %   Platelets 151  150 - 400 K/uL   Neutrophils Relative % 76  43 - 77 %   Neutro Abs 5.3  1.7 - 7.7 K/uL   Lymphocytes Relative 16  12 - 46 %   Lymphs Abs 1.1  0.7 - 4.0 K/uL   Monocytes Relative 8  3 - 12 %   Monocytes Absolute 0.5  0.1 - 1.0 K/uL   Eosinophils Relative 0  0 - 5 %   Eosinophils Absolute 0.0  0.0 - 0.7 K/uL   Basophils Relative 0  0 - 1 %   Basophils Absolute 0.0  0.0 - 0.1 K/uL  TROPONIN I     Status: Abnormal   Collection Time    09/01/14  6:58 AM      Result Value Ref Range   Troponin I 0.34 (*) <0.30 ng/mL   Comment: CRITICAL RESULT CALLED TO, READ BACK BY AND VERIFIED WITH:     KNIGHT,C AT 7:45AM ON 09/01/14 BY FESTERMAN,C                Due to the release kinetics of cTnI,     a negative result within the first hours     of the onset of symptoms does not rule out     myocardial infarction with certainty.     If myocardial infarction is still suspected,     repeat the test at appropriate intervals.    ABGS No results found for this basename: PHART, PCO2, PO2ART, TCO2, HCO3,  in the last 72 hours CULTURES Recent Results (from the past 240 hour(s))  MRSA PCR SCREENING     Status: None   Collection Time    09/01/14  1:49 AM      Result Value Ref Range Status   MRSA by PCR NEGATIVE  NEGATIVE Final   Comment:            The GeneXpert MRSA Assay (FDA     approved for NASAL specimens     only), is one  component of a     comprehensive MRSA colonization     surveillance program. It is not     intended to diagnose MRSA     infection nor to guide or     monitor treatment for     MRSA infections.  CULTURE, BLOOD (ROUTINE X 2)     Status: None   Collection Time    09/01/14  2:08 AM      Result Value Ref Range Status   Specimen Description BLOOD LEFT HAND   Final   Special Requests BOTTLES DRAWN AEROBIC AND ANAEROBIC 4CC EACH   Final   Culture PENDING    Incomplete   Report Status PENDING   Incomplete  CULTURE, BLOOD (ROUTINE X 2)     Status: None   Collection Time    09/01/14  2:08 AM      Result Value Ref Range Status   Specimen Description BLOOD PORT   Final   Special Requests     Final   Value: BOTTLES DRAWN AEROBIC AND ANAEROBIC AEB 6CC ANA 4CC   Culture PENDING   Incomplete   Report Status PENDING   Incomplete   Studies/Results: Dg Chest Portable 1 View  08/31/2014   CLINICAL DATA:  Shortness of breath and weakness. History of end-stage renal disease.  EXAM: PORTABLE CHEST - 1 VIEW  COMPARISON:  08/18/2007  FINDINGS: Grossly unchanged enlarged cardiac silhouette and mediastinal contours post median sternotomy and CABG. Atherosclerotic plaque within the thoracic aorta. The pulmonary vasculature is indistinct with cephalization of flow. Worsening bilateral infrahilar and left basilar heterogeneous/consolidative opacities. Trace bilateral effusions are not excluded. No pneumothorax. Unchanged bones.  IMPRESSION: Findings compatible with pulmonary edema and bibasilar opacities, left greater than right, atelectasis versus infiltrate. A follow-up chest radiograph in 4 to 6 weeks after treatment is recommended to ensure resolution.   Electronically Signed   By: Sandi Mariscal M.D.   On: 08/31/2014 22:05    Medications: I have reviewed the patient's current medications.  Assesment:   Active Problems:   Weakness   Anasarca   ESRD (end stage renal disease) on dialysis   HCAP (healthcare-associated pneumonia)   Heart block   Sepsis    Plan: Medications reviewed Nephrology consult Hemodialysis as planned Cardiology consult    LOS: 1 day   Barbara Cummings 09/01/2014, 8:02 AM

## 2014-09-01 NOTE — Progress Notes (Signed)
Barbara Cummings  MRN: [id number]  DOB/AGE: December 22, 1926 80 y.o.  Primary Care Physician:FANTA,TESFAYE, MD  Admit date: 08/31/2014  Chief Complaint:  Chief Complaint  Patient presents with  . Weakness    S-Pt presented on  08/31/2014 with  Chief Complaint  Patient presents with  . Weakness  .    Pt today feels better.Pt offers no new complaints.  Meds . calcium chloride      . cinacalcet  30 mg Oral QPC supper  . [START ON 09/03/2014] epoetin alfa  10,000 Units Intravenous Q M,W,F-HD  . heparin subcutaneous  5,000 Units Subcutaneous 3 times per day  . insulin aspart  0-9 Units Subcutaneous 6 times per day  . sevelamer carbonate  1,600 mg Oral TID WC  . simvastatin  40 mg Oral Daily  . sodium chloride  3 mL Intravenous Q12H  . sodium chloride  3 mL Intravenous Q12H      Physical Exam: Vital signs in last 24 hours: Temp:  [96 F (35.6 C)-97.9 F (36.6 C)] 97.9 F (36.6 C) (09/09 0153) Pulse Rate:  [39-53] 49 (09/09 0600) Resp:  [15-32] 17 (09/09 0600) BP: (96-189)/(33-155) 112/33 mmHg (09/09 0600) SpO2:  [86 %-100 %] 94 % (09/09 0600) Weight:  [136 lb 3.9 oz (61.8 kg)] 136 lb 3.9 oz (61.8 kg) (09/09 0600) Weight change:  Last BM Date: 08/31/14    Physical Exam: General- pt is awake,following commands  Resp- No acute REsp distress, CTA B/L NO Rhonchi CVS- S1S2 brady in rhtythm GIT- BS+, soft, NT, ND EXT- NO LE Edema, NO Cyanosis   Lab Results: CBC  Recent Labs  08/31/14 2131 09/01/14 0658  WBC 9.0 7.0  HGB 10.2* 10.1*  HCT 31.2* 30.0*  PLT 177 151    BMET  Recent Labs  08/31/14 2131 09/01/14 0658  NA 138 143  K 5.5* 3.7  CL 98 100  CO2 15* 26  GLUCOSE 332* 95  BUN 90* 24*  CREATININE 11.16* 4.33*  CALCIUM 7.6* 9.9    MICRO Recent Results (from the past 240 hour(s))  MRSA PCR SCREENING     Status: None   Collection Time    09/01/14  1:49 AM      Result Value Ref Range Status   MRSA by PCR NEGATIVE  NEGATIVE Final   Comment:          The GeneXpert MRSA Assay (FDA     approved for NASAL specimens     only), is one component of a     comprehensive MRSA colonization     surveillance program. It is not     intended to diagnose MRSA     infection nor to guide or     monitor treatment for     MRSA infections.  CULTURE, BLOOD (ROUTINE X 2)     Status: None   Collection Time    09/01/14  2:08 AM      Result Value Ref Range Status   Specimen Description BLOOD LEFT HAND   Final   Special Requests BOTTLES DRAWN AEROBIC AND ANAEROBIC 4CC EACH   Final   Culture PENDING   Incomplete   Report Status PENDING   Incomplete  CULTURE, BLOOD (ROUTINE X 2)     Status: None   Collection Time    09/01/14  2:08 AM      Result Value Ref Range Status   Specimen Description BLOOD PORT   Final   Special Requests     Final  Value: BOTTLES DRAWN AEROBIC AND ANAEROBIC AEB 6CC ANA 4CC   Culture PENDING   Incomplete   Report Status PENDING   Incomplete      Lab Results  Component Value Date   CALCIUM 9.9 09/01/2014       Impression: 1)Renal  ESRD on HD On MWF as outpt. Pt missed HD on Monday. Pt will be dialyzed today  2)HTN BP stable   3)Anemia HGb at goal (9--11) On EPO  4)CKD Mineral-Bone Disorder Secondary Hyperparathyroidism present  on Sensipar Phosphorus at goal. On Binders  5)CVs- Admitted with heat block Cardiology and Primary MD following  6)Electrolytes Hyperkalemic    Sec to ESRD    Bow better NOrmonatremic   7)Acid base Co2 at goal  8) CHF-Admited with fluid overload Sec to Missed HD session Now better   9) ID POssible Pneumonia On ABX  Plan:  Will dialyze today but pt is beng transferred to [facility] for possible need of pacemaker.       BHUTANI,MANPREET S 09/01/2014, 9:04 AM

## 2014-09-02 ENCOUNTER — Inpatient Hospital Stay (HOSPITAL_COMMUNITY): Payer: Medicare Other

## 2014-09-02 LAB — COMPREHENSIVE METABOLIC PANEL
ALBUMIN: 3 g/dL — AB (ref 3.5–5.2)
ALK PHOS: 189 U/L — AB (ref 39–117)
ALT: 13 U/L (ref 0–35)
ANION GAP: 19 — AB (ref 5–15)
AST: 62 U/L — AB (ref 0–37)
BUN: 45 mg/dL — ABNORMAL HIGH (ref 6–23)
CO2: 24 mEq/L (ref 19–32)
Calcium: 7.8 mg/dL — ABNORMAL LOW (ref 8.4–10.5)
Chloride: 98 mEq/L (ref 96–112)
Creatinine, Ser: 6.68 mg/dL — ABNORMAL HIGH (ref 0.50–1.10)
GFR calc Af Amer: 6 mL/min — ABNORMAL LOW (ref >90)
GFR calc non Af Amer: 5 mL/min — ABNORMAL LOW (ref >90)
Glucose, Bld: 91 mg/dL (ref 70–99)
POTASSIUM: 4.6 meq/L (ref 3.7–5.3)
SODIUM: 141 meq/L (ref 137–147)
TOTAL PROTEIN: 7 g/dL (ref 6.0–8.3)
Total Bilirubin: 0.4 mg/dL (ref 0.3–1.2)

## 2014-09-02 LAB — CBC WITH DIFFERENTIAL/PLATELET
Basophils Absolute: 0 10*3/uL (ref 0.0–0.1)
Basophils Relative: 0 % (ref 0–1)
Eosinophils Absolute: 0 10*3/uL (ref 0.0–0.7)
Eosinophils Relative: 1 % (ref 0–5)
HEMATOCRIT: 32.1 % — AB (ref 36.0–46.0)
HEMOGLOBIN: 10.4 g/dL — AB (ref 12.0–15.0)
Lymphocytes Relative: 15 % (ref 12–46)
Lymphs Abs: 0.8 10*3/uL (ref 0.7–4.0)
MCH: 30.8 pg (ref 26.0–34.0)
MCHC: 32.4 g/dL (ref 30.0–36.0)
MCV: 95 fL (ref 78.0–100.0)
MONOS PCT: 15 % — AB (ref 3–12)
Monocytes Absolute: 0.8 10*3/uL (ref 0.1–1.0)
NEUTROS ABS: 3.8 10*3/uL (ref 1.7–7.7)
Neutrophils Relative %: 69 % (ref 43–77)
Platelets: 140 10*3/uL — ABNORMAL LOW (ref 150–400)
RBC: 3.38 MIL/uL — ABNORMAL LOW (ref 3.87–5.11)
RDW: 14.7 % (ref 11.5–15.5)
WBC: 5.5 10*3/uL (ref 4.0–10.5)

## 2014-09-02 LAB — GLUCOSE, CAPILLARY
GLUCOSE-CAPILLARY: 110 mg/dL — AB (ref 70–99)
GLUCOSE-CAPILLARY: 133 mg/dL — AB (ref 70–99)
Glucose-Capillary: 105 mg/dL — ABNORMAL HIGH (ref 70–99)

## 2014-09-02 MED ORDER — YOU HAVE A PACEMAKER BOOK
Freq: Once | Status: AC
  Administered 2014-09-02: 04:00:00
  Filled 2014-09-02: qty 1

## 2014-09-02 NOTE — Discharge Instructions (Signed)
° ° °  Supplemental Discharge Instructions for  Pacemaker/Defibrillator Patients  Activity No heavy lifting or vigorous activity with your left/right arm for 6 to 8 weeks.  Do not raise your left/right arm above your head for one week.  Gradually raise your affected arm as drawn below.               9/11                    9/12                             9/13                         9/14 __  NO DRIVING for   1 week  ; you may begin driving on   6/38/93  .  WOUND CARE   Keep the wound area clean and dry.  Do not get this area wet for one week. No showers for one week; you may shower on     .   The tape/steri-strips on your wound will fall off; do not pull them off.  No bandage is needed on the site.  DO  NOT apply any creams, oils, or ointments to the wound area.   If you notice any drainage or discharge from the wound, any swelling or bruising at the site, or you develop a fever > 101? F after you are discharged home, call the office at once.  Special Instructions   You are still able to use cellular telephones; use the ear opposite the side where you have your pacemaker/defibrillator.  Avoid carrying your cellular phone near your device.   When traveling through airports, show security personnel your identification card to avoid being screened in the metal detectors.  Ask the security personnel to use the hand wand.   Avoid arc welding equipment, MRI testing (magnetic resonance imaging), TENS units (transcutaneous nerve stimulators).  Call the office for questions about other devices.   Avoid electrical appliances that are in poor condition or are not properly grounded.   Microwave ovens are safe to be near or to operate.  Additional information for defibrillator patients should your device go off:   If your device goes off ONCE and you feel fine afterward, notify the device clinic nurses.   If your device goes off ONCE and you do not feel well afterward, call 911.   If your device goes  off TWICE, call 911.   If your device goes off THREE times in one day, call 911.  DO NOT DRIVE YOURSELF OR A FAMILY MEMBER WITH A DEFIBRILLATOR TO THE HOSPITAL--CALL 911.

## 2014-09-02 NOTE — Discharge Summary (Addendum)
ELECTROPHYSIOLOGY ROUNDING NOTE    Patient Name: Barbara Cummings Date of Encounter: 09/02/2014    SUBJECTIVE:Patient feels well this morning.  No chest pain or shortness of breath.  Moderate incisional soreness.    Admitted 09-01-14 for CHB, status post pacemaker implantation.   Patient lives with her daughter.  She would like to go home today.   TELEMETRY: Reviewed telemetry pt in sinus rhythm with ventricular pacing, short run NSVT Filed Vitals:   09/01/14 1701 09/01/14 2015 09/02/14 0440 09/02/14 0525  BP: 141/55 101/43  123/34  Pulse: 87 86  78  Temp: 98.3 F (36.8 C) 98.4 F (36.9 C)  98.4 F (36.9 C)  TempSrc:  Oral  Oral  Resp: 14 16  17   Height:      Weight:   128 lb 3.2 oz (58.151 kg)   SpO2: 100% 95%  93%    Intake/Output Summary (Last 24 hours) at 09/02/14 2130 Last data filed at 09/01/14 2025  Gross per 24 hour  Intake 513.75 ml  Output      0 ml  Net 513.75 ml    CURRENT MEDICATIONS: . antiseptic oral rinse  7 mL Mouth Rinse BID  .  ceFAZolin (ANCEF) IV  1 g Intravenous Q12H  . [START ON 09/03/2014] ceFEPime (MAXIPIME) IV  2 g Intravenous Q M,W,F-HD  . cinacalcet  30 mg Oral QPC supper  . [START ON 09/03/2014] epoetin alfa  10,000 Units Intravenous Q M,W,F-HD  . heparin subcutaneous  5,000 Units Subcutaneous 3 times per day  . insulin aspart  0-9 Units Subcutaneous 6 times per day  . sevelamer carbonate  1,600 mg Oral TID WC  . simvastatin  40 mg Oral Daily  . sodium chloride  3 mL Intravenous Q12H  . [START ON 09/03/2014] vancomycin  750 mg Intravenous Q M,W,F-HD    LABS: Basic Metabolic Panel:  Recent Labs  86/57/84 2131 09/01/14 0658  NA 138 143  K 5.5* 3.7  CL 98 100  CO2 15* 26  GLUCOSE 332* 95  BUN 90* 24*  CREATININE 11.16* 4.33*  CALCIUM 7.6* 9.9   Liver Function Tests:  Recent Labs  08/31/14 2131 09/01/14 0658  AST 33 32  ALT 27 26  ALKPHOS 221* 213*  BILITOT 0.6 0.7  PROT 6.9 7.3  ALBUMIN 3.2* 3.2*   CBC:  Recent  Labs  08/31/14 2131 09/01/14 0658  WBC 9.0 7.0  NEUTROABS  --  5.3  HGB 10.2* 10.1*  HCT 31.2* 30.0*  MCV 93.7 91.7  PLT 177 151   Cardiac Enzymes:  Recent Labs  08/31/14 2131 09/01/14 0049 09/01/14 0658  TROPONINI <0.30 <0.30 0.34*   Hemoglobin A1C:  Recent Labs  09/01/14 0049  HGBA1C 5.9*    Radiology/Studies:  Dg Chest Portable 1 View 08/31/2014   CLINICAL DATA:  Shortness of breath and weakness. History of end-stage renal disease.  EXAM: PORTABLE CHEST - 1 VIEW  COMPARISON:  08/18/2007  FINDINGS: Grossly unchanged enlarged cardiac silhouette and mediastinal contours post median sternotomy and CABG. Atherosclerotic plaque within the thoracic aorta. The pulmonary vasculature is indistinct with cephalization of flow. Worsening bilateral infrahilar and left basilar heterogeneous/consolidative opacities. Trace bilateral effusions are not excluded. No pneumothorax. Unchanged bones.  IMPRESSION: Findings compatible with pulmonary edema and bibasilar opacities, left greater than right, atelectasis versus infiltrate. A follow-up chest radiograph in 4 to 6 weeks after treatment is recommended to ensure resolution.   Electronically Signed   By: Simonne Come M.D.   On:  08/31/2014 22:05   Final result pending, leads in stable position.  PHYSICAL EXAM stable appearing elderly woman, NAD HEENT: Unremarkable,[place], AT Neck:  6 cm JVD, no thyromegally Back:  No CVA tenderness Lungs:  Clear with no wheezes, rales, or rhonchi, PPM incision clean and dry without hematoma HEART:  Regular rate rhythm, no murmurs, no rubs, no clicks Abd:  soft, positive bowel sounds, no organomegally, no rebound, no guarding Ext:  2 plus pulses, no edema, no cyanosis, no clubbing Skin:  No rashes no nodules Neuro:  CN II through XII intact, motor grossly intact   DEVICE INTERROGATION: Normal device function. No R waves due to CHB with no escape  Active Problems:   Weakness   ESRD (end stage renal disease)  on dialysis   Demand ischemia   Complete heart block   Coronary atherosclerosis of native coronary artery   Hyperkalemia   Wound care, restrictions reviewed with patient.  Routine follow-up scheduled.  She is stable for discharge. She may proceed with HD tomorrow at her outpatient dialysis center.  Leonia Reeves.D.

## 2014-09-02 NOTE — Progress Notes (Signed)
Patient on rt side and had short run of v-tach then back to v .pacing. Cont. To monitor patient and rhythm. R.N. Aware.

## 2014-09-02 NOTE — Progress Notes (Signed)
Pt/family given discharge instructions, medication lists, follow up appointments, and when to call the doctor.  Pt/family verbalizes understanding. Pt given signs and symptoms of infection. Pt given movement restrictions for left arm. All questions answered. Thomas Hoff, RN

## 2014-09-06 LAB — CULTURE, BLOOD (ROUTINE X 2)
Culture: NO GROWTH
Culture: NO GROWTH

## 2014-09-13 NOTE — H&P (View-Only) (Signed)
Reason for Consult:complete heart block Referring Physician: Dr. Fernanda Drum is an 80 y.o. female.   HPI: the patient is an 80 yo woman with a h/o ESRD on HD, CAD, s/p CABG, who presented with symptomatic bradycardia and found to have CHB. She was on no meds to cause bradycardia and had normal electrolytes after HD. She is transferred for PPM. She has been dialyzed on the right side for years. Prior to that she dialyzed on the left. She has not had syncope. She does have LV dysfunction. She has class 2 CHF.  PMH: Past Medical History  Diagnosis Date  . ESRD on hemodialysis   . Type 2 diabetes mellitus   . Coronary atherosclerosis of native coronary artery     Multivessel s/p CABG 2003  . Chronic anemia     Capsule endoscopy 2007 - gastric erosions, small bowel telangiectasias, one small  . Essential hypertension, benign   . Mixed hyperlipidemia   . History of cardiomyopathy     LVEF 30-35% in 2003    PSHX: Past Surgical History  Procedure Laterality Date  . Coronary artery bypass graft  2003    Dr. Laneta Simmers - LIMA to LAD, SVG to OM1 and OM2, SVG to PDA  . Left arm av fistula  2003  . Eye surgery  2004  . Perirectal surgery  2004  . Eye surgery  2005  . Left arm av fistula thrombectomy  2007  . Left arm av fistula thrombectomy  2008    FAMHX: Family History  Problem Relation Age of Onset  . Family history unknown: Yes    Social History:  reports that she has quit smoking. Her smoking use included Cigarettes. She smoked 0.00 packs per day. She does not have any smokeless tobacco history on file. She reports that she does not drink alcohol or use illicit drugs.  Allergies: No Known Allergies  Medications: reviewed  Dg Chest Portable 1 View  08/31/2014   CLINICAL DATA:  Shortness of breath and weakness. History of end-stage renal disease.  EXAM: PORTABLE CHEST - 1 VIEW  COMPARISON:  08/18/2007  FINDINGS: Grossly unchanged enlarged cardiac silhouette and  mediastinal contours post median sternotomy and CABG. Atherosclerotic plaque within the thoracic aorta. The pulmonary vasculature is indistinct with cephalization of flow. Worsening bilateral infrahilar and left basilar heterogeneous/consolidative opacities. Trace bilateral effusions are not excluded. No pneumothorax. Unchanged bones.  IMPRESSION: Findings compatible with pulmonary edema and bibasilar opacities, left greater than right, atelectasis versus infiltrate. A follow-up chest radiograph in 4 to 6 weeks after treatment is recommended to ensure resolution.   Electronically Signed   By: Simonne Come M.D.   On: 08/31/2014 22:05    ROS  As stated in the HPI and negative for all other systems.  Physical Exam  Vitals:Blood pressure 82/62, pulse 39, temperature 98.1 F (36.7 C), temperature source Oral, resp. rate 16, height 5\' 7"  (1.702 m), weight 136 lb 3.9 oz (61.8 kg), SpO2 96.00%.  Chronically ill appearing elderly woman, NAD HEENT: Unremarkable Neck:  No JVD, no thyromegally Back:  No CVA tenderness Lungs:  Clear with no wheezes HEART:  Regular brady rhythm, 2/6 systolic murmurs, no rubs, no clicks Abd:  soft, positive bowel sounds, no organomegally, no rebound, no guarding Ext:  2 plus pulses, no edema, no cyanosis, no clubbing Skin:  No rashes no nodules Neuro:  CN II through XII intact, motor grossly intact  Assessment/Plan: 1. Complete heart block with no reversible cause 2. ESRD  on HD 3. CAD with LV dysfunction 4. Longstanding HTN Rec: I have discussed the treatment options with the patient and her family. The risks/benefits/goals/expectations of PPM insertion have been discussed with the patient and she wishes to proceed.   Sharlot Gowda TaylorMD 09/01/2014, 1:57 PM

## 2014-09-13 NOTE — Interval H&P Note (Signed)
History and Physical Interval Note:  09/13/2014 8:45 AM  Barbara Cummings  has presented today for surgery, with the diagnosis of bradicardia  The various methods of treatment have been discussed with the patient and family. After consideration of risks, benefits and other options for treatment, the patient has consented to  Procedure(s): PERMANENT PACEMAKER INSERTION (N/A) as a surgical intervention .  The patient's history has been reviewed, patient examined, no change in status, stable for surgery.  I have reviewed the patient's chart and labs.  Questions were answered to the patient's satisfaction.     Lewayne Bunting

## 2014-09-15 ENCOUNTER — Ambulatory Visit: Payer: Medicare Other

## 2014-09-23 ENCOUNTER — Encounter: Payer: Self-pay | Admitting: Internal Medicine

## 2014-09-23 ENCOUNTER — Ambulatory Visit (INDEPENDENT_AMBULATORY_CARE_PROVIDER_SITE_OTHER): Payer: Medicare Other | Admitting: *Deleted

## 2014-09-23 DIAGNOSIS — I442 Atrioventricular block, complete: Secondary | ICD-10-CM

## 2014-09-23 LAB — MDC_IDC_ENUM_SESS_TYPE_INCLINIC
Battery Remaining Longevity: 135.6 mo
Brady Statistic RA Percent Paced: 1.7 %
Brady Statistic RV Percent Paced: 99 %
Lead Channel Impedance Value: 412.5 Ohm
Lead Channel Pacing Threshold Amplitude: 0.625 V
Lead Channel Pacing Threshold Pulse Width: 0.4 ms
Lead Channel Sensing Intrinsic Amplitude: 2.1 mV
Lead Channel Sensing Intrinsic Amplitude: 9.1 mV
Lead Channel Setting Pacing Amplitude: 0.875
Lead Channel Setting Pacing Amplitude: 1.625
Lead Channel Setting Pacing Pulse Width: 0.4 ms
MDC IDC MSMT BATTERY VOLTAGE: 3.08 V
MDC IDC MSMT LEADCHNL RA PACING THRESHOLD PULSEWIDTH: 0.4 ms
MDC IDC MSMT LEADCHNL RV IMPEDANCE VALUE: 550 Ohm
MDC IDC MSMT LEADCHNL RV PACING THRESHOLD AMPLITUDE: 0.625 V
MDC IDC PG SERIAL: 7668966
MDC IDC SESS DTM: 20151001122807
MDC IDC SET LEADCHNL RV SENSING SENSITIVITY: 4 mV

## 2014-09-23 NOTE — Progress Notes (Signed)
Wound check appointment. Steri-strips removed. Wound without redness or edema. Incision edges approximated, wound well healed. Normal device function. Thresholds, sensing, and impedances consistent with implant measurements. Device programmed at 3.5V/auto capture programmed on for extra safety margin until 3 month visit. Histogram distribution appropriate for patient and level of activity. 18 mode switches, max A 194bpm, total burden 1%.  No high ventricular rates noted. Patient educated about wound care, arm mobility, lifting restrictions. ROV in 3 months with implanting physician.

## 2014-12-02 ENCOUNTER — Encounter (HOSPITAL_COMMUNITY): Payer: Self-pay | Admitting: Internal Medicine

## 2014-12-27 ENCOUNTER — Encounter: Payer: Self-pay | Admitting: Internal Medicine

## 2014-12-27 ENCOUNTER — Ambulatory Visit (INDEPENDENT_AMBULATORY_CARE_PROVIDER_SITE_OTHER): Payer: Medicare Other | Admitting: Internal Medicine

## 2014-12-27 VITALS — BP 128/58 | HR 56 | Ht 67.0 in | Wt 129.0 lb

## 2014-12-27 DIAGNOSIS — I442 Atrioventricular block, complete: Secondary | ICD-10-CM

## 2014-12-27 DIAGNOSIS — I472 Ventricular tachycardia: Secondary | ICD-10-CM

## 2014-12-27 DIAGNOSIS — I4729 Other ventricular tachycardia: Secondary | ICD-10-CM

## 2014-12-27 DIAGNOSIS — I2510 Atherosclerotic heart disease of native coronary artery without angina pectoris: Secondary | ICD-10-CM

## 2014-12-27 DIAGNOSIS — Z950 Presence of cardiac pacemaker: Secondary | ICD-10-CM

## 2014-12-27 LAB — MDC_IDC_ENUM_SESS_TYPE_INCLINIC
Battery Remaining Longevity: 132 mo
Brady Statistic RA Percent Paced: 1.1 %
Brady Statistic RV Percent Paced: 99 %
Lead Channel Impedance Value: 400 Ohm
Lead Channel Pacing Threshold Amplitude: 0.625 V
Lead Channel Sensing Intrinsic Amplitude: 2.9 mV
Lead Channel Setting Pacing Amplitude: 0.875
Lead Channel Setting Pacing Pulse Width: 0.4 ms
MDC IDC MSMT BATTERY VOLTAGE: 3.05 V
MDC IDC MSMT LEADCHNL RA PACING THRESHOLD AMPLITUDE: 0.5 V
MDC IDC MSMT LEADCHNL RA PACING THRESHOLD PULSEWIDTH: 0.4 ms
MDC IDC MSMT LEADCHNL RV IMPEDANCE VALUE: 562.5 Ohm
MDC IDC MSMT LEADCHNL RV PACING THRESHOLD PULSEWIDTH: 0.4 ms
MDC IDC MSMT LEADCHNL RV SENSING INTR AMPL: 12 mV
MDC IDC PG SERIAL: 7668966
MDC IDC SESS DTM: 20160104110238
MDC IDC SET LEADCHNL RA PACING AMPLITUDE: 1.5 V
MDC IDC SET LEADCHNL RV SENSING SENSITIVITY: 4 mV

## 2014-12-27 NOTE — Assessment & Plan Note (Signed)
Her [employer] DDD PM is working normally. Will recheck in several months.  

## 2014-12-27 NOTE — Assessment & Plan Note (Signed)
She denies anginal symptoms. No change in medical therapy. 

## 2014-12-27 NOTE — Assessment & Plan Note (Signed)
She is pacing in the RV approximately 99% of the time. Will follow.

## 2014-12-27 NOTE — Progress Notes (Signed)
      HPI Mrs. Eiler returns today for followup. She is a very pleasant 81 yo woman with ESRD on HD, complete heart block, HTN, dyslipidemia, and CAD. She has done well since her PPM insertion. No syncope. Her dyspnea is class 2. No angina. No Known Allergies   Current Outpatient Prescriptions  Medication Sig Dispense Refill  . cinacalcet (SENSIPAR) 30 MG tablet Take 30 mg by mouth daily after supper.     Marland Kitchen glipiZIDE (GLUCOTROL) 5 MG tablet Take by mouth daily before breakfast.    . sevelamer carbonate (RENVELA) 800 MG tablet Take 1,600 mg by mouth 3 (three) times daily with meals.    . simvastatin (ZOCOR) 40 MG tablet Take 40 mg by mouth daily.     No current facility-administered medications for this visit.     Past Medical History  Diagnosis Date  . ESRD on hemodialysis   . Type 2 diabetes mellitus   . Coronary atherosclerosis of native coronary artery     Multivessel s/p CABG 2003  . Chronic anemia     Capsule endoscopy 2007 - gastric erosions, small bowel telangiectasias, one small  . Essential hypertension, benign   . Mixed hyperlipidemia   . History of cardiomyopathy     LVEF 30-35% in 2003    ROS:   All systems reviewed and negative except as noted in the HPI.   Past Surgical History  Procedure Laterality Date  . Coronary artery bypass graft  2003    Dr. Laneta Simmers - LIMA to LAD, SVG to OM1 and OM2, SVG to PDA  . Left arm av fistula  2003  . Eye surgery  2004  . Perirectal surgery  2004  . Eye surgery  2005  . Left arm av fistula thrombectomy  2007  . Left arm av fistula thrombectomy  2008  . Permanent pacemaker insertion N/A 09/01/2014    Procedure: PERMANENT PACEMAKER INSERTION;  Surgeon: Marinus Maw, MD;  Location: [facility] CATH LAB;  Service: Cardiovascular;  Laterality: N/A;     Family History  Problem Relation Age of Onset  . Family history unknown: Yes     History   Social History  . Marital Status: Single    Spouse Name: N/A    Number of  Children: N/A  . Years of Education: N/A   Occupational History  . Not on file.   Social History Main Topics  . Smoking status: Former Smoker    Types: Cigarettes  . Smokeless tobacco: Not on file  . Alcohol Use: No  . Drug Use: No  . Sexual Activity: Not on file   Other Topics Concern  . Not on file   Social History Narrative     BP 128/58 mmHg  Pulse 56  Ht 5\' 7"  (1.702 m)  Wt 129 lb (58.514 kg)  BMI 20.20 kg/m2  SpO2 97%  Physical Exam:  Well appearing elderly woman, NAD HEENT: Unremarkable Neck:  No JVD, no thyromegally Lymphatics:  No adenopathy Back:  No CVA tenderness Lungs:  Clear with no wheezes HEART:  Regular rate rhythm, no murmurs, no rubs, no clicks Abd:  soft, positive bowel sounds, no organomegally, no rebound, no guarding Ext:  2 plus pulses, no edema, no cyanosis, no clubbing, right arm with dilated fistula. Skin:  No rashes no nodules Neuro:  CN II through XII intact, motor grossly intact   DEVICE  Normal device function.  See PaceArt for details.   Assess/Plan:

## 2014-12-27 NOTE — Assessment & Plan Note (Signed)
Interogation of her PPM demonstrates NSVT which is asymptomatic. She will continue her current meds. No change in therapy.

## 2014-12-27 NOTE — Patient Instructions (Signed)
Your physician wants you to follow-up in: 9 MONTHS WITH DR. Court Joy will receive a reminder letter in the mail two months in advance. If you don't receive a letter, please call our office to schedule the follow-up appointment.  Remote monitoring is used to monitor your Pacemaker of ICD from home. This monitoring reduces the number of office visits required to check your device to one time per year. It allows [place] to keep an eye on the functioning of your device to ensure it is working properly. You are scheduled for a device check from home on April 4TH. You may send your transmission at any time that day. If you have a wireless device, the transmission will be sent automatically. After your physician reviews your transmission, you will receive a postcard with your next transmission date.  Thank you for choosing [facility]!!

## 2014-12-28 ENCOUNTER — Encounter: Payer: Self-pay | Admitting: Internal Medicine

## 2014-12-28 LAB — PACEMAKER DEVICE OBSERVATION

## 2015-01-24 ENCOUNTER — Encounter (HOSPITAL_COMMUNITY): Payer: Self-pay

## 2015-01-24 ENCOUNTER — Emergency Department (HOSPITAL_COMMUNITY)
Admission: EM | Admit: 2015-01-24 | Discharge: 2015-01-24 | Disposition: A | Discharge status: Home / Self Care | Payer: Medicare Other | Attending: Emergency Medicine | Admitting: Emergency Medicine

## 2015-01-24 DIAGNOSIS — I2510 Atherosclerotic heart disease of native coronary artery without angina pectoris: Secondary | ICD-10-CM | POA: Insufficient documentation

## 2015-01-24 DIAGNOSIS — Z951 Presence of aortocoronary bypass graft: Secondary | ICD-10-CM | POA: Diagnosis not present

## 2015-01-24 DIAGNOSIS — E119 Type 2 diabetes mellitus without complications: Secondary | ICD-10-CM | POA: Insufficient documentation

## 2015-01-24 DIAGNOSIS — E782 Mixed hyperlipidemia: Secondary | ICD-10-CM | POA: Insufficient documentation

## 2015-01-24 DIAGNOSIS — Z87891 Personal history of nicotine dependence: Secondary | ICD-10-CM | POA: Insufficient documentation

## 2015-01-24 DIAGNOSIS — Z043 Encounter for examination and observation following other accident: Secondary | ICD-10-CM | POA: Diagnosis present

## 2015-01-24 DIAGNOSIS — Z79899 Other long term (current) drug therapy: Secondary | ICD-10-CM | POA: Insufficient documentation

## 2015-01-24 DIAGNOSIS — W01198A Fall on same level from slipping, tripping and stumbling with subsequent striking against other object, initial encounter: Secondary | ICD-10-CM | POA: Diagnosis not present

## 2015-01-24 DIAGNOSIS — Y9389 Activity, other specified: Secondary | ICD-10-CM | POA: Insufficient documentation

## 2015-01-24 DIAGNOSIS — I120 Hypertensive chronic kidney disease with stage 5 chronic kidney disease or end stage renal disease: Secondary | ICD-10-CM | POA: Diagnosis not present

## 2015-01-24 DIAGNOSIS — Z992 Dependence on renal dialysis: Secondary | ICD-10-CM | POA: Diagnosis not present

## 2015-01-24 DIAGNOSIS — Z862 Personal history of diseases of the blood and blood-forming organs and certain disorders involving the immune mechanism: Secondary | ICD-10-CM | POA: Diagnosis not present

## 2015-01-24 DIAGNOSIS — Y9289 Other specified places as the place of occurrence of the external cause: Secondary | ICD-10-CM | POA: Insufficient documentation

## 2015-01-24 DIAGNOSIS — N186 End stage renal disease: Secondary | ICD-10-CM | POA: Insufficient documentation

## 2015-01-24 DIAGNOSIS — Y998 Other external cause status: Secondary | ICD-10-CM | POA: Diagnosis not present

## 2015-01-24 DIAGNOSIS — W19XXXA Unspecified fall, initial encounter: Secondary | ICD-10-CM

## 2015-01-24 NOTE — ED Notes (Signed)
Pt reports Friday her great grandson hugged her and she accidentally slid to the floor and hit her buttocks.  Reports went for dialysis this morning and they told her she had to be evaluated before she could get dialysis.  Pt denies any pain or soreness.

## 2015-01-24 NOTE — Discharge Instructions (Signed)
There is no need for x-rays, you may go back to dialysis to continue her dialysis session. Tylenol as needed

## 2015-01-24 NOTE — ED Provider Notes (Signed)
CSN: [id number]     Arrival date & time 01/24/15  0810 History  This chart was scribed for Barbara Roller, MD by Ronney Lion, ED Scribe. This patient was seen in room APA19/APA19 and the patient's care was started at 8:32 AM.    Chief Complaint  Patient presents with  . Fall    The history is provided by the patient. No language interpreter was used.    HPI Comments: Barbara Cummings is a 81 y.o. female on hemodialysis who presents to the Emergency Department complaining of a fall that occurred 3 days ago after her 31 year old great grandson hugged her tight and she slid to the floor on her buttocks. She went to dialysis, who said they refused to do dialysis until she got evaluated at the ED for the fall. She denies any trouble or any symptoms after the fall. Patient took Tylenol, which alleviated any pain.      Past Medical History  Diagnosis Date  . ESRD on hemodialysis   . Type 2 diabetes mellitus   . Coronary atherosclerosis of native coronary artery     Multivessel s/p CABG 2003  . Chronic anemia     Capsule endoscopy 2007 - gastric erosions, small bowel telangiectasias, one small  . Essential hypertension, benign   . Mixed hyperlipidemia   . History of cardiomyopathy     LVEF 30-35% in 2003   Past Surgical History  Procedure Laterality Date  . Coronary artery bypass graft  2003    Dr. Laneta Simmers - LIMA to LAD, SVG to OM1 and OM2, SVG to PDA  . Left arm av fistula  2003  . Eye surgery  2004  . Perirectal surgery  2004  . Eye surgery  2005  . Left arm av fistula thrombectomy  2007  . Left arm av fistula thrombectomy  2008  . Permanent pacemaker insertion N/A 09/01/2014    Procedure: PERMANENT PACEMAKER INSERTION;  Surgeon: Marinus Maw, MD;  Location: [facility] CATH LAB;  Service: Cardiovascular;  Laterality: N/A;   Family History  Problem Relation Age of Onset  . Family history unknown: Yes   History  Substance Use Topics  . Smoking status: Former Smoker    Types: Cigarettes  .  Smokeless tobacco: Not on file  . Alcohol Use: No   OB History    No data available     Review of Systems  Musculoskeletal: Negative for myalgias.  Neurological: Negative for headaches.      Allergies  Review of patient's allergies indicates no known allergies.  Home Medications   Prior to Admission medications   Medication Sig Start Date End Date Taking? Authorizing Provider  cinacalcet (SENSIPAR) 30 MG tablet Take 30 mg by mouth daily after supper.     Historical Provider, MD  glipiZIDE (GLUCOTROL) 5 MG tablet Take by mouth daily before breakfast.    Historical Provider, MD  sevelamer carbonate (RENVELA) 800 MG tablet Take 1,600 mg by mouth 3 (three) times daily with meals.    Historical Provider, MD  simvastatin (ZOCOR) 40 MG tablet Take 40 mg by mouth daily.    Historical Provider, MD   There were no vitals taken for this visit. Physical Exam  Constitutional: She appears well-developed and well-nourished. No distress.  HENT:  Head: Normocephalic and atraumatic.  Mouth/Throat: Oropharynx is clear and moist. No oropharyngeal exudate.  Eyes: Conjunctivae and EOM are normal. Pupils are equal, round, and reactive to light. Right eye exhibits no discharge. Left eye  exhibits no discharge. No scleral icterus.  Neck: Normal range of motion. Neck supple. No JVD present. No thyromegaly present.  Cardiovascular: Normal rate, regular rhythm, normal heart sounds and intact distal pulses.  Exam reveals no gallop and no friction rub.   No murmur heard. Pulmonary/Chest: Effort normal and breath sounds normal. No respiratory distress. She has no wheezes. She has no rales.  Abdominal: Soft. Bowel sounds are normal. She exhibits no distension and no mass. There is no tenderness.  Musculoskeletal: Normal range of motion. She exhibits no edema or tenderness.  No spinal tenderness of the C, T, or, L spine.  Lymphadenopathy:    She has no cervical adenopathy.  Neurological: She is alert.  Coordination normal.  Moves all extremities 4 without difficulty, no weakness or numbness, normal speech, cranial nerves III through XII intact  Skin: Skin is warm and dry. No rash noted. No erythema.  Psychiatric: She has a normal mood and affect. Her behavior is normal.  Nursing note and vitals reviewed.   ED Course  Procedures (including critical care time)  DIAGNOSTIC STUDIES: Oxygen Saturation is 93% on room air, adequate by my interpretation.    COORDINATION OF CARE: 8:35 AM - Discussed treatment plan with pt at bedside which includes sending patient back to dialysis, and pt agreed to plan.   Labs Review Labs Reviewed - No data to display  Imaging Review No results found.    MDM   Final diagnoses:  None   No indictaion for further testing - stable to go back to dialysis  I personally performed the services described in this documentation, which was scribed in my presence. The recorded information has been reviewed and is accurate.     Barbara Roller, MD 01/24/15 [phone]

## 2015-02-22 DEATH — deceased

## 2015-03-28 ENCOUNTER — Telehealth: Payer: Self-pay | Admitting: Cardiology

## 2015-03-28 ENCOUNTER — Encounter: Payer: Medicare Other | Admitting: *Deleted

## 2015-03-28 NOTE — Telephone Encounter (Signed)
Spoke with pt and reminded pt of remote transmission that is due today. Pt verbalized understanding.   

## 2015-03-29 ENCOUNTER — Encounter: Payer: Self-pay | Admitting: Cardiology

## 2015-04-12 NOTE — Op Note (Signed)
PATIENT NAME:  Barbara Cummings, Barbara Cummings MR#:  [id number] DATE OF BIRTH:  09-06-1926  DATE OF PROCEDURE:  07/08/2012  PREOPERATIVE DIAGNOSES:  1. Complication AV dialysis device with prolonged bleeding following decannulation.  2. Aneurysmal degeneration, right arm brachiocephalic fistula.  3. End-stage renal disease requiring hemodialysis.   POSTOPERATIVE DIAGNOSES:  1. Complication AV dialysis device with prolonged bleeding following decannulation.  2. Aneurysmal degeneration, right arm brachiocephalic fistula.  3. End-stage renal disease requiring hemodialysis.   PROCEDURES PERFORMED:  1. Contrast injection, right arm brachiocephalic fistula.  2. Percutaneous transluminal angioplasty of the midportion near the venous cannulation site of the brachial axillary fistula.   SURGEON: Levora Dredge, MD  SEDATION: Versed 3 mg plus fentanyl 100 mcg administered IV. Continuous ECG, pulse oximetry and cardiopulmonary monitoring was performed throughout the entire procedure by the interventional radiology nurse. Total sedation time is 50 minutes.   ACCESS: 7 French sheath, antegrade direction, left arm brachiocephalic fistula.   CONTRAST USED: Isovue 36 mL.   FLUORO TIME: 3.3 minutes.   INDICATIONS: Ms. Zetts is an 81 year old woman who presents to the office with increasing problems at dialysis, particularly following decannulation. She has been experiencing prolonged bleeding following her dialysis runs. She also has been having increasing size of the aneurysms and there is concern for the status of her access. Noninvasive study demonstrates a quadrupling of the velocities in the midportion of the fistula consistent with a very high-grade stenosis. She is therefore undergoing evaluation with angiography and subsequently intervention. Risks and benefits were reviewed, all questions answered, and the patient agrees to proceed.   DESCRIPTION OF PROCEDURE: The patient is taken to special procedures and  placed in the supine position with the right arm extended palm upward. The right arm is prepped and draped in sterile fashion. After adequate sedation is achieved, 1% lidocaine is infiltrated in the soft tissue and then access to the fistula is obtained near the anastomosis, in an antegrade direction and microwire followed by microsheath, J-wire followed by a 6 French sheath is inserted. Hand injection of contrast is then used to demonstrate the anatomy of the fistula as well as the central veins. High-grade stricture is noted right at the site the duplex suggested. 3000 units of heparin is given. KMP and floppy Glidewire are used to negotiate through the multiple aneurysms and then the Glidewire is exchanged for a Magic torque wire and first an 8 x 4 Dorado and subsequently a 10 x 4 Dorado. The sheath was then up-sized to a 7 [place] sheath and a 12 x 4 Dorado used to angioplasty the stenosis. Following the 12 x 4 angioplasty, palpation of the fistula demonstrates markedly less pulsatility and significant improvement in the thrill.   Follow-up angiography including retrograde study when the 10 mm balloon was inflated demonstrates wide patency at the arterial anastomosis.   A pursestring suture is placed around the sheath, sheath is removed, light pressure is held and there is no immediate complication.   INTERPRETATION: Initial views of the fistula demonstrate multiple serial large aneurysms. There is a high-grade stenosis noted near the venous cannulation site. More proximally the cephalic vein is widely patent. Confluence appears patent, although somewhat irregular. Subclavian and innominate veins as well as superior vena cava are widely patent. Reflux into the arterial system demonstrates patency at this level as well. Following angioplasty there is significant improvement on the follow-up contrast injections. There is also clinically significant improvement in the fistula by palpation.   SUMMARY:  Successful angioplasty of a  mid fistula stenosis of brachial cephalic fistula. ____________________________ Renford Dills, MD ggs:slb D: 07/08/2012 19:28:13 ET     T: 07/09/2012 09:30:13 ET        JOB#: [id number] cc: Terrial Rhodes, MD Renford Dills MD ELECTRONICALLY SIGNED 07/15/2012 13:00

## 2015-04-16 NOTE — Op Note (Signed)
PATIENT NAME:  Barbara Cummings, Barbara Cummings MR#:  [id number] DATE OF BIRTH:  [birth year]  DATE OF PROCEDURE:  07/20/2014  PREOPERATIVE DIAGNOSES:  1.  Complication arteriovenous dialysis device.   2.  End-stage renal disease requiring hemodialysis.   POSTOPERATIVE DIAGNOSES:  1.  Complication arteriovenous dialysis device.   2.  End-stage renal disease requiring hemodialysis.   PROCEDURES PERFORMED:  1.  Contrast injection, right arm brachiocephalic fistula.  2.  Percutaneous transluminal angioplasty to 8 mm, right subclavian cephalic confluence.   SURGEON: Levora Dredge, MD.   SEDATION: Versed 4 mg plus fentanyl 150 mcg administered IV. Continuous ECG, pulse oximetry, and cardiopulmonary monitoring is performed throughout the entire procedure by the interventional radiology nurse. Total sedation time is 45 minutes.   ACCESS: A 6 French sheath, antegrade direction, right arm fistula.   CONTRAST USED: Isovue 35 mL.   FLUOROSCOPY TIME: 2.4 minutes.   INDICATIONS: Barbara Cummings is an 81 year old woman who presents with increasing recirculation and prolonged bleeding times following her dialysis runs. She is referred to the office from the center. Physical examination demonstrates moderate pulsatility and noninvasive studies demonstrated stenosis at the cephalic confluence. The risks and benefits for angiography and possible intervention are reviewed. All questions answered. The patient agrees to proceed.   DESCRIPTION OF PROCEDURE: The patient is taken to special procedures and placed in the supine position. After adequate sedation is achieved, she is positioned supine with her right arm extended palm upward. Right arm is prepped and draped in sterile fashion. Appropriate timeout is called.   Lidocaine 1% is infiltrated in the soft tissues overlying the aneurysmal portion of the fistula near the arterial anastomosis and, in an antegrade direction, the fistula is accessed with a micropuncture needle.  Microwire followed by micro sheath, J-wire followed by a 6 French sheath.   Hand injection of contrast demonstrates the fistula, as well as the central veins. After review of these images, 3000 units of heparin is given. Magic torque wire is advanced through the fistula and into the central venous anatomy. Under fluoroscopy, is guided into the inferior vena cava.   Magnified view of the cephalic confluence is then obtained by hand injection and subsequently a 6 x 4 Lutonix balloon is inflated across the stenosis of the cephalic subclavian confluence. This is performed for 1 full minute.  Subsequently, an 8 x 4 looped Dorado balloon is advanced across this lesion and inflated to 14 atmospheres for 2 minutes. Follow-up imaging demonstrates resolution of the narrowing at the confluence. The aneurysmal segment of the fistula is then imaged in a steep oblique to compare to the AP already obtained. No other narrowings are noted. Wire is removed. The sheath is pulled after pursestring suture is placed. There are no immediate complications.   INTERPRETATION: The brachiocephalic fistula is patent. There are multiple very large aneurysms. They appear to be relatively free of thrombus; however, it is quite clear, based on the contrast pattern swirling, that there will be significant recirculation just based on aneurysm size. There are no narrowings noted just proximal to the aneurysms. The cephalic vein as it crosses the shoulder is widely patent and measures approximately 7-8 mm in diameter. At the confluence with the subclavian vein, the cephalic vein is narrowed, approximately 70% -75% narrowing. Central veins are widely patent. Following angioplasty, first a 6 and then 8 mm, there is resolution of this narrowing improved quality to the fistula with palpation.   SUMMARY: Successful salvage of right arm brachiocephalic fistula as described above.  ____________________________ Renford Dills,  MD ggs:ts D: 07/20/2014 11:56:05 ET T: 07/20/2014 12:42:04 ET JOB#: [id number]  cc: Renford Dills, MD, <Dictator> Renford Dills MD ELECTRONICALLY SIGNED 08/03/2014 17:15

## 2015-08-25 ENCOUNTER — Encounter
Admission: RE | Disposition: A | Discharge status: Home / Self Care | Payer: Self-pay | Source: Ambulatory Visit | Attending: Vascular Surgery

## 2015-08-25 ENCOUNTER — Ambulatory Visit
Admission: RE | Admit: 2015-08-25 | Discharge: 2015-08-25 | Disposition: A | Discharge status: Home / Self Care | Payer: Medicare Other | Source: Ambulatory Visit | Attending: Vascular Surgery | Admitting: Vascular Surgery

## 2015-08-25 DIAGNOSIS — I2510 Atherosclerotic heart disease of native coronary artery without angina pectoris: Secondary | ICD-10-CM | POA: Diagnosis not present

## 2015-08-25 DIAGNOSIS — I429 Cardiomyopathy, unspecified: Secondary | ICD-10-CM | POA: Insufficient documentation

## 2015-08-25 DIAGNOSIS — I120 Hypertensive chronic kidney disease with stage 5 chronic kidney disease or end stage renal disease: Secondary | ICD-10-CM | POA: Insufficient documentation

## 2015-08-25 DIAGNOSIS — E782 Mixed hyperlipidemia: Secondary | ICD-10-CM | POA: Diagnosis not present

## 2015-08-25 DIAGNOSIS — Z951 Presence of aortocoronary bypass graft: Secondary | ICD-10-CM | POA: Insufficient documentation

## 2015-08-25 DIAGNOSIS — Z87891 Personal history of nicotine dependence: Secondary | ICD-10-CM | POA: Insufficient documentation

## 2015-08-25 DIAGNOSIS — N186 End stage renal disease: Secondary | ICD-10-CM | POA: Insufficient documentation

## 2015-08-25 DIAGNOSIS — Z992 Dependence on renal dialysis: Secondary | ICD-10-CM | POA: Insufficient documentation

## 2015-08-25 DIAGNOSIS — D649 Anemia, unspecified: Secondary | ICD-10-CM | POA: Diagnosis not present

## 2015-08-25 DIAGNOSIS — Y832 Surgical operation with anastomosis, bypass or graft as the cause of abnormal reaction of the patient, or of later complication, without mention of misadventure at the time of the procedure: Secondary | ICD-10-CM | POA: Insufficient documentation

## 2015-08-25 DIAGNOSIS — E1122 Type 2 diabetes mellitus with diabetic chronic kidney disease: Secondary | ICD-10-CM | POA: Diagnosis not present

## 2015-08-25 DIAGNOSIS — T82898A Other specified complication of vascular prosthetic devices, implants and grafts, initial encounter: Secondary | ICD-10-CM | POA: Insufficient documentation

## 2015-08-25 HISTORY — PX: PERIPHERAL VASCULAR CATHETERIZATION: SHX172C

## 2015-08-25 SURGERY — DIALYSIS/PERMA CATHETER INSERTION
Anesthesia: Moderate Sedation

## 2015-08-25 MED ORDER — HEPARIN (PORCINE) IN NACL 2-0.9 UNIT/ML-% IJ SOLN
INTRAMUSCULAR | Status: AC
  Filled 2015-08-25: qty 500

## 2015-08-25 MED ORDER — SODIUM CHLORIDE 0.9 % IV SOLN: INTRAVENOUS | Status: DC

## 2015-08-25 MED ORDER — LIDOCAINE-EPINEPHRINE (PF) 1 %-1:200000 IJ SOLN
INTRAMUSCULAR | Status: AC
  Filled 2015-08-25: qty 30

## 2015-08-25 MED ORDER — MIDAZOLAM HCL 5 MG/5ML IJ SOLN
INTRAMUSCULAR | Status: AC
  Filled 2015-08-25: qty 5

## 2015-08-25 MED ORDER — FENTANYL CITRATE (PF) 100 MCG/2ML IJ SOLN
INTRAMUSCULAR | Status: DC | PRN
  Administered 2015-08-25: 50 ug via INTRAVENOUS

## 2015-08-25 MED ORDER — CEFAZOLIN SODIUM 1-5 GM-% IV SOLN
1.0000 g | Freq: Once | INTRAVENOUS | Status: AC
  Administered 2015-08-25: 1 g via INTRAVENOUS

## 2015-08-25 MED ORDER — MIDAZOLAM HCL 2 MG/2ML IJ SOLN
INTRAMUSCULAR | Status: DC | PRN
  Administered 2015-08-25: 2 mg via INTRAVENOUS

## 2015-08-25 MED ORDER — FENTANYL CITRATE (PF) 100 MCG/2ML IJ SOLN
INTRAMUSCULAR | Status: AC
  Filled 2015-08-25: qty 2

## 2015-08-25 MED ORDER — HEPARIN SODIUM (PORCINE) 1000 UNIT/ML IJ SOLN
INTRAMUSCULAR | Status: AC
  Filled 2015-08-25: qty 1

## 2015-08-25 MED ORDER — LIDOCAINE-EPINEPHRINE (PF) 1 %-1:200000 IJ SOLN
INTRAMUSCULAR | Status: DC | PRN
  Administered 2015-08-25: 10 mL via INTRADERMAL

## 2015-08-25 MED ORDER — SODIUM CHLORIDE 0.9 % IJ SOLN
INTRAMUSCULAR | Status: AC
  Filled 2015-08-25: qty 15

## 2015-08-25 MED ORDER — CEFAZOLIN SODIUM 1-5 GM-% IV SOLN
INTRAVENOUS | Status: AC
  Filled 2015-08-25: qty 50

## 2015-08-25 MED ORDER — HEPARIN SODIUM (PORCINE) 10000 UNIT/ML IJ SOLN
INTRAMUSCULAR | Status: AC
  Filled 2015-08-25: qty 1

## 2015-08-25 SURGICAL SUPPLY — 8 items
ADH SKN CLS APL DERMABOND .7 (GAUZE/BANDAGES/DRESSINGS) ×1
CATH PALINDROME RT-P 15FX19CM (CATHETERS) ×2 IMPLANT
DERMABOND ADVANCED (GAUZE/BANDAGES/DRESSINGS) ×2
DERMABOND ADVANCED .7 DNX12 (GAUZE/BANDAGES/DRESSINGS) IMPLANT
PACK ANGIOGRAPHY (CUSTOM PROCEDURE TRAY) ×2 IMPLANT
SUT MNCRL AB 4-0 PS2 18 (SUTURE) ×2 IMPLANT
SUT PROLENE 0 CT 1 30 (SUTURE) ×2 IMPLANT
TOWEL OR 17X26 4PK STRL BLUE (TOWEL DISPOSABLE) ×2 IMPLANT

## 2015-08-25 NOTE — Op Note (Signed)
OPERATIVE NOTE    PRE-OPERATIVE DIAGNOSIS: 1. ESRD 2. Aneurysmal AVF  POST-OPERATIVE DIAGNOSIS: same as above  PROCEDURE: 1. Ultrasound guidance for vascular access to the right internal jugular vein 2. Fluoroscopic guidance for placement of catheter 3. Placement of a 19 cm tip to cuff tunneled hemodialysis catheter via the right internal jugular vein  SURGEON: Festus Barren, MD  ANESTHESIA:  Local/MCS  ESTIMATED BLOOD LOSS: minimal  FINDING(S): 1.  Patent right internal jugular vein  SPECIMEN(S):  None  INDICATIONS:   Barbara Cummings is a 81 y.o. female who presents with ESRD and an aneurysmal AVF.  The patient needs long term dialysis access for their ESRD, and a Permcath is necessary.  Risks and benefits are discussed and informed consent is obtained.    DESCRIPTION: After obtaining full informed written consent, the patient was brought back to the vascular suited. The patient's right neck and chest were sterilely prepped and draped in a sterile surgical field was created.  The right internal jugular vein was visualized with ultrasound and found to be patent. It was then accessed under direct ultrasound guidance and a permanent image was recorded. A wire was placed. After skin nick and dilatation, the peel-away sheath was placed over the wire. I then turned my attention to an area under the clavicle. Approximately 1-2 fingerbreadths below the clavicle a small counterincision was created and tunneled from the subclavicular incision to the access site. Using fluoroscopic guidance, a 19 centimeter tip to cuff tunneled hemodialysis catheter was selected, and tunneled from the subclavicular incision to the access site. It was then placed through the peel-away sheath and the peel-away sheath was removed. Using fluoroscopic guidance the catheter tips were parked in the right atrium. The appropriate distal connectors were placed. It withdrew blood well and flushed easily with heparinized saline and  a concentrated heparin solution was then placed. It was secured to the chest wall with 2 Prolene sutures. The access incision was closed single 4-0 Monocryl. A 4-0 Monocryl pursestring suture was placed around the exit site. Sterile dressings were placed. The patient tolerated the procedure well and was taken to the recovery room in stable condition.  COMPLICATIONS: None  CONDITION: Stable  Ariona Deschene  08/25/2015, 9:09 AM

## 2015-08-25 NOTE — H&P (Signed)
[facility] VASCULAR & VEIN SPECIALISTS Admission History & Physical  MRN : [id number]  Barbara Cummings is a 81 y.o. ([birth year]) female who presents with chief complaint of No chief complaint on file. Marland Kitchen  History of Present Illness: Patient with ESRD, needs a functional access as AVF have not remained open and functional.  Dialysis center has requested a Permcath.  Will place today  Current Facility-Administered Medications  Medication Dose Route Frequency Provider Last Rate Last Dose  . 0.9 %  sodium chloride infusion   Intravenous Continuous Kimberly A Stegmayer, PA-C      . ceFAZolin (ANCEF) IVPB 1 g/50 mL premix  1 g Intravenous Once Tonette Lederer, PA-C        Past Medical History  Diagnosis Date  . ESRD on hemodialysis   . Type 2 diabetes mellitus   . Coronary atherosclerosis of native coronary artery     Multivessel s/p CABG 2003  . Chronic anemia     Capsule endoscopy 2007 - gastric erosions, small bowel telangiectasias, one small  . Essential hypertension, benign   . Mixed hyperlipidemia   . History of cardiomyopathy     LVEF 30-35% in 2003    Past Surgical History  Procedure Laterality Date  . Coronary artery bypass graft  2003    Dr. Laneta Simmers - LIMA to LAD, SVG to OM1 and OM2, SVG to PDA  . Left arm av fistula  2003  . Eye surgery  2004  . Perirectal surgery  2004  . Eye surgery  2005  . Left arm av fistula thrombectomy  2007  . Left arm av fistula thrombectomy  2008  . Permanent pacemaker insertion N/A 09/01/2014    Procedure: PERMANENT PACEMAKER INSERTION;  Surgeon: Marinus Maw, MD;  Location: [facility] CATH LAB;  Service: Cardiovascular;  Laterality: N/A;    Social History Social History  Substance Use Topics  . Smoking status: Former Smoker    Types: Cigarettes  . Smokeless tobacco: Not on file  . Alcohol Use: No  No IVDU  Family History No known history of bleeding disorders, clotting disorders, or autoimmune diseases  No Known Allergies   REVIEW  OF SYSTEMS (Negative unless checked)  Constitutional: [x] Weight loss  [] Fever  [] Chills Cardiac: [] Chest pain   [] Chest pressure   [] Palpitations   [] Shortness of breath when laying flat   [] Shortness of breath at rest   [] Shortness of breath with exertion. Vascular:  [] Pain in legs with walking   [] Pain in legs at rest   [] Pain in legs when laying flat   [] Claudication   [] Pain in feet when walking  [] Pain in feet at rest  [] Pain in feet when laying flat   [] History of DVT   [] Phlebitis   [] Swelling in legs   [] Varicose veins   [] Non-healing ulcers Pulmonary:   [] Uses home oxygen   [] Productive cough   [] Hemoptysis   [] Wheeze  [] COPD   [] Asthma Neurologic:  [] Dizziness  [] Blackouts   [] Seizures   [] History of stroke   [] History of TIA  [] Aphasia   [] Temporary blindness   [] Dysphagia   [] Weakness or numbness in arms   [] Weakness or numbness in legs Musculoskeletal:  [] Arthritis   [] Joint swelling   [] Joint pain   [] Low back pain Hematologic:  [] Easy bruising  [] Easy bleeding   [] Hypercoagulable state   [] Anemic  [] Hepatitis Gastrointestinal:  [] Blood in stool   [] Vomiting blood  [] Gastroesophageal reflux/heartburn   [] Difficulty swallowing. Genitourinary:  [x] Chronic kidney disease   []   Difficult urination  Frequent urination  Burning with urination   Blood in urine Skin:  Rashes   Ulcers   Wounds Psychological:  History of anxiety    History of major depression.  Physical Examination  Filed Vitals:   08/25/15 0714 08/25/15 0719  BP: 123/64   Pulse: 78   Temp: 97.8 F (36.6 C)   TempSrc: Oral   Height:  (1.702 m)   Weight:  57 kg (125 lb 10.6 oz)  SpO2: 96%    Body mass index is 19.68 kg/(m^2). Gen: WD/WN, NAD Head: /AT, No temporalis wasting. Prominent temp pulse not noted. Ear/Nose/Throat: Hearing grossly intact, nares w/o erythema or drainage, oropharynx w/o Erythema/Exudate,  Eyes: PERRLA, EOMI.  Neck: Supple, no nuchal rigidity.  No bruit or JVD.   Pulmonary:  Good air movement, clear to auscultation bilaterally, no use of accessory muscles.  Cardiac: RRR, normal S1, S2, no Murmurs, rubs or gallops. Vascular:  Vessel Right Left  Radial Palpable Palpable                                   Gastrointestinal: soft, non-tender/non-distended. No guarding/reflex.  Musculoskeletal: No cyanosis or clubbing.  Mild LE swelling Neurologic: CN 2-12 intact. Pain and light touch intact in extremities.  Psychiatric: Judgment intact, Mood & affect appropriate for pt's clinical situation. Dermatologic: No rashes or ulcers noted.  No cellulitis or open wounds. Lymph : No Cervical, Axillary, or Inguinal lymphadenopathy.     CBC Lab Results  Component Value Date   WBC 5.5 09/02/2014   HGB 10.4* 09/02/2014   HCT 32.1* 09/02/2014   MCV 95.0 09/02/2014   PLT 140* 09/02/2014    BMET    Component Value Date/Time   NA 141 09/02/2014 0638   K 4.6 09/02/2014 0638   K 3.8 07/08/2012 1159   CL 98 09/02/2014 0638   CO2 24 09/02/2014 0638   GLUCOSE 91 09/02/2014 0638   BUN 45* 09/02/2014 0638   CREATININE 6.68* 09/02/2014 0638   CALCIUM 7.8* 09/02/2014 0638   GFRNONAA 5* 09/02/2014 0638   GFRAA 6* 09/02/2014 0638   CrCl cannot be calculated (Patient has no serum creatinine result on file.).  COAG Lab Results  Component Value Date   INR 1.33 08/31/2014   INR 1.1 06/30/2007    Radiology No results found.    Assessment/Plan 1. ESRD: needs functional access.  Will place dialysis catheter today 2. Dysfunction of dialysis access. Will place new catheter today 3. HTn. Stable continue outpatient meds   DEW,JASON, MD  08/25/2015 7:54 AM

## 2015-08-26 ENCOUNTER — Encounter: Payer: Self-pay | Admitting: Vascular Surgery

## 2015-08-30 ENCOUNTER — Encounter
Admission: RE | Admit: 2015-08-30 | Discharge: 2015-08-30 | Disposition: A | Discharge status: Home / Self Care | Payer: Medicare Other | Source: Ambulatory Visit | Attending: Vascular Surgery | Admitting: Vascular Surgery

## 2015-08-30 ENCOUNTER — Other Ambulatory Visit: Payer: Self-pay | Admitting: Vascular Surgery

## 2015-08-30 DIAGNOSIS — D649 Anemia, unspecified: Secondary | ICD-10-CM | POA: Diagnosis not present

## 2015-08-30 DIAGNOSIS — E782 Mixed hyperlipidemia: Secondary | ICD-10-CM | POA: Diagnosis not present

## 2015-08-30 DIAGNOSIS — Z992 Dependence on renal dialysis: Secondary | ICD-10-CM | POA: Diagnosis not present

## 2015-08-30 DIAGNOSIS — I2510 Atherosclerotic heart disease of native coronary artery without angina pectoris: Secondary | ICD-10-CM | POA: Diagnosis not present

## 2015-08-30 DIAGNOSIS — Z87891 Personal history of nicotine dependence: Secondary | ICD-10-CM | POA: Diagnosis not present

## 2015-08-30 DIAGNOSIS — I120 Hypertensive chronic kidney disease with stage 5 chronic kidney disease or end stage renal disease: Secondary | ICD-10-CM | POA: Diagnosis not present

## 2015-08-30 DIAGNOSIS — N186 End stage renal disease: Secondary | ICD-10-CM | POA: Diagnosis not present

## 2015-08-30 DIAGNOSIS — I10 Essential (primary) hypertension: Secondary | ICD-10-CM | POA: Diagnosis not present

## 2015-08-30 DIAGNOSIS — Y841 Kidney dialysis as the cause of abnormal reaction of the patient, or of later complication, without mention of misadventure at the time of the procedure: Secondary | ICD-10-CM | POA: Diagnosis not present

## 2015-08-30 DIAGNOSIS — Z951 Presence of aortocoronary bypass graft: Secondary | ICD-10-CM | POA: Diagnosis not present

## 2015-08-30 DIAGNOSIS — E1122 Type 2 diabetes mellitus with diabetic chronic kidney disease: Secondary | ICD-10-CM | POA: Diagnosis not present

## 2015-08-30 DIAGNOSIS — Z4901 Encounter for fitting and adjustment of extracorporeal dialysis catheter: Secondary | ICD-10-CM | POA: Diagnosis present

## 2015-08-30 DIAGNOSIS — I429 Cardiomyopathy, unspecified: Secondary | ICD-10-CM | POA: Diagnosis not present

## 2015-08-30 LAB — CBC
HCT: 35.4 % (ref 35.0–47.0)
Hemoglobin: 11.9 g/dL — ABNORMAL LOW (ref 12.0–16.0)
MCH: 32.8 pg (ref 26.0–34.0)
MCHC: 33.7 g/dL (ref 32.0–36.0)
MCV: 97.3 fL (ref 80.0–100.0)
Platelets: 180 10*3/uL (ref 150–440)
RBC: 3.63 MIL/uL — AB (ref 3.80–5.20)
RDW: 14.3 % (ref 11.5–14.5)
WBC: 6.5 10*3/uL (ref 3.6–11.0)

## 2015-08-30 LAB — BASIC METABOLIC PANEL
ANION GAP: 20 — AB (ref 5–15)
BUN: 97 mg/dL — ABNORMAL HIGH (ref 6–20)
CALCIUM: 7.1 mg/dL — AB (ref 8.9–10.3)
CHLORIDE: 96 mmol/L — AB (ref 101–111)
CO2: 19 mmol/L — AB (ref 22–32)
Creatinine, Ser: 10.62 mg/dL — ABNORMAL HIGH (ref 0.44–1.00)
GFR calc non Af Amer: 3 mL/min — ABNORMAL LOW (ref >60)
GFR, EST AFRICAN AMERICAN: 3 mL/min — AB (ref >60)
GLUCOSE: 198 mg/dL — AB (ref 65–99)
POTASSIUM: 4.2 mmol/L (ref 3.5–5.1)
Sodium: 135 mmol/L (ref 135–145)

## 2015-08-30 LAB — PROTIME-INR
INR: 1.11
Prothrombin Time: 14.5 seconds (ref 11.4–15.0)

## 2015-08-30 LAB — APTT: APTT: 33 s (ref 24–36)

## 2015-08-30 NOTE — Patient Instructions (Signed)
  Your procedure is scheduled on: Thursday 09/08/2015 Report to Day Surgery. 2ND FLOOR MEDICAL MALL ENTRANCE To find out your arrival time please call [phone] between 1PM - 3PM on Wednesday 09/07/2015.  Remember: Instructions that are not followed completely may result in serious medical risk, up to and including death, or upon the discretion of your surgeon and anesthesiologist your surgery may need to be rescheduled.    __X__ 1. Do not eat food or drink liquids after midnight. No gum chewing or hard candies.     __X__ 2. No Alcohol for 24 hours before or after surgery.   ____ 3. Bring all medications with you on the day of surgery if instructed.    __X__ 4. Notify your doctor if there is any change in your medical condition     (cold, fever, infections).     Do not wear jewelry, make-up, hairpins, clips or nail polish.  Do not wear lotions, powders, or perfumes.   Do not shave 48 hours prior to surgery. Men may shave face and neck.  Do not bring valuables to the hospital.    [facility] is not responsible for any belongings or valuables.               Contacts, dentures or bridgework may not be worn into surgery.  Leave your suitcase in the car. After surgery it may be brought to your room.  For patients admitted to the hospital, discharge time is determined by your                treatment team.   Patients discharged the day of surgery will not be allowed to drive home.   Please read over the following fact sheets that you were given:   Surgical Site Infection Prevention   ____ Take these medicines the morning of surgery with A SIP OF WATER:    1.   2.   3.   4.  5.  6.  ____ Fleet Enema (as directed)   __X__ Use CHG Soap as directed   SAGE WIPES Tuesday NIGHT AND Wednesday NIGHT  ____ Use inhalers on the day of surgery  ____ Stop metformin 2 days prior to surgery    ____ Take 1/2 of usual insulin dose the night before surgery and none on the morning of surgery.    ____ Stop Coumadin/Plavix/aspirin on   ____ Stop Anti-inflammatories on    ____ Stop supplements until after surgery.    ____ Bring C-Pap to the hospital.

## 2015-08-30 NOTE — OR Nursing (Signed)
Abnormal EKG, medical clearance request sent to patient PCP. Verified confirmation. Notified Dr Wyn Quaker office.

## 2015-08-31 ENCOUNTER — Encounter
Admission: RE | Disposition: A | Discharge status: Home / Self Care | Payer: Self-pay | Source: Ambulatory Visit | Attending: Vascular Surgery

## 2015-08-31 ENCOUNTER — Encounter: Payer: Self-pay | Admitting: Vascular Surgery

## 2015-08-31 ENCOUNTER — Ambulatory Visit
Admission: RE | Admit: 2015-08-31 | Discharge: 2015-08-31 | Disposition: A | Discharge status: Home / Self Care | Payer: Medicare Other | Source: Ambulatory Visit | Attending: Vascular Surgery | Admitting: Vascular Surgery

## 2015-08-31 DIAGNOSIS — Z4901 Encounter for fitting and adjustment of extracorporeal dialysis catheter: Secondary | ICD-10-CM | POA: Insufficient documentation

## 2015-08-31 DIAGNOSIS — D649 Anemia, unspecified: Secondary | ICD-10-CM | POA: Insufficient documentation

## 2015-08-31 DIAGNOSIS — E1122 Type 2 diabetes mellitus with diabetic chronic kidney disease: Secondary | ICD-10-CM | POA: Insufficient documentation

## 2015-08-31 DIAGNOSIS — N186 End stage renal disease: Secondary | ICD-10-CM | POA: Insufficient documentation

## 2015-08-31 DIAGNOSIS — Z992 Dependence on renal dialysis: Secondary | ICD-10-CM | POA: Insufficient documentation

## 2015-08-31 DIAGNOSIS — E782 Mixed hyperlipidemia: Secondary | ICD-10-CM | POA: Insufficient documentation

## 2015-08-31 DIAGNOSIS — I120 Hypertensive chronic kidney disease with stage 5 chronic kidney disease or end stage renal disease: Secondary | ICD-10-CM | POA: Insufficient documentation

## 2015-08-31 DIAGNOSIS — Z951 Presence of aortocoronary bypass graft: Secondary | ICD-10-CM | POA: Insufficient documentation

## 2015-08-31 DIAGNOSIS — Z87891 Personal history of nicotine dependence: Secondary | ICD-10-CM | POA: Insufficient documentation

## 2015-08-31 DIAGNOSIS — I429 Cardiomyopathy, unspecified: Secondary | ICD-10-CM | POA: Insufficient documentation

## 2015-08-31 DIAGNOSIS — Y841 Kidney dialysis as the cause of abnormal reaction of the patient, or of later complication, without mention of misadventure at the time of the procedure: Secondary | ICD-10-CM | POA: Insufficient documentation

## 2015-08-31 DIAGNOSIS — I2510 Atherosclerotic heart disease of native coronary artery without angina pectoris: Secondary | ICD-10-CM | POA: Insufficient documentation

## 2015-08-31 HISTORY — PX: PERIPHERAL VASCULAR CATHETERIZATION: SHX172C

## 2015-08-31 HISTORY — PX: EXCHANGE OF A DIALYSIS CATHETER: SHX5818

## 2015-08-31 SURGERY — DIALYSIS/PERMA CATHETER INSERTION
Anesthesia: Moderate Sedation

## 2015-08-31 MED ORDER — SODIUM CHLORIDE 0.9 % IV SOLN
INTRAVENOUS | Status: DC
  Administered 2015-08-31 (×2): via INTRAVENOUS

## 2015-08-31 MED ORDER — CEFAZOLIN SODIUM 1-5 GM-% IV SOLN
INTRAVENOUS | Status: AC
  Filled 2015-08-31: qty 50

## 2015-08-31 MED ORDER — HEPARIN SODIUM (PORCINE) 10000 UNIT/ML IJ SOLN
INTRAMUSCULAR | Status: AC
  Filled 2015-08-31: qty 1

## 2015-08-31 MED ORDER — IOHEXOL 300 MG/ML  SOLN
INTRAMUSCULAR | Status: DC | PRN
Start: 2015-08-31 — End: 2015-08-31
  Administered 2015-08-31: 5 mL via INTRA_ARTERIAL

## 2015-08-31 MED ORDER — MIDAZOLAM HCL 5 MG/5ML IJ SOLN
INTRAMUSCULAR | Status: AC
  Filled 2015-08-31: qty 5

## 2015-08-31 MED ORDER — FENTANYL CITRATE (PF) 100 MCG/2ML IJ SOLN
INTRAMUSCULAR | Status: AC
  Filled 2015-08-31: qty 2

## 2015-08-31 MED ORDER — FENTANYL CITRATE (PF) 100 MCG/2ML IJ SOLN
INTRAMUSCULAR | Status: DC | PRN
  Administered 2015-08-31: 50 ug via INTRAVENOUS

## 2015-08-31 MED ORDER — DEXTROSE 5 % IV SOLN
1.5000 g | INTRAVENOUS | Status: DC
  Filled 2015-08-31: qty 1.5

## 2015-08-31 MED ORDER — MIDAZOLAM HCL 2 MG/2ML IJ SOLN
INTRAMUSCULAR | Status: DC | PRN
  Administered 2015-08-31: 2 mg via INTRAVENOUS

## 2015-08-31 MED ORDER — HEPARIN (PORCINE) IN NACL 2-0.9 UNIT/ML-% IJ SOLN
INTRAMUSCULAR | Status: AC
  Filled 2015-08-31: qty 500

## 2015-08-31 MED ORDER — HEPARIN SODIUM (PORCINE) 1000 UNIT/ML IJ SOLN
INTRAMUSCULAR | Status: AC
  Filled 2015-08-31: qty 1

## 2015-08-31 MED ORDER — LIDOCAINE-EPINEPHRINE (PF) 1 %-1:200000 IJ SOLN
INTRAMUSCULAR | Status: AC
  Filled 2015-08-31: qty 30

## 2015-08-31 SURGICAL SUPPLY — 4 items
CATH PALINDROME-P 19CM W/VT (CATHETERS) ×4 IMPLANT
GUIDEWIRE SUPER STIFF .035X180 (WIRE) ×4 IMPLANT
PACK ANGIOGRAPHY (CUSTOM PROCEDURE TRAY) ×4 IMPLANT
TOWEL OR 17X26 4PK STRL BLUE (TOWEL DISPOSABLE) ×4 IMPLANT

## 2015-08-31 NOTE — Progress Notes (Signed)
Pt doing well post procedure, eating lunch, daughter with pt. Discharge instructions given, to go to dialysis after leaving here today, denies complaints,

## 2015-08-31 NOTE — H&P (Signed)
[facility] VASCULAR & VEIN SPECIALISTS Admission History & Physical  MRN : [id number]  Barbara Cummings is a 81 y.o. (09/22/1926) female who presents with chief complaint of No chief complaint on file. Marland Kitchen  History of Present Illness: Patient had a Permacth placed last week, and this is not working well for dialysis. We have been asked by the dialysis center to exchange.  Current Facility-Administered Medications  Medication Dose Route Frequency Provider Last Rate Last Dose  . 0.9 %  sodium chloride infusion   Intravenous Continuous Kimberly A Stegmayer, PA-C      . cefUROXime (ZINACEF) 1.5 g in dextrose 5 % 50 mL IVPB  1.5 g Intravenous 30 min Pre-Op Tonette Lederer, PA-C        Past Medical History  Diagnosis Date  . Type 2 diabetes mellitus   . Coronary atherosclerosis of native coronary artery     Multivessel s/p CABG 2003  . Chronic anemia     Capsule endoscopy 2007 - gastric erosions, small bowel telangiectasias, one small  . Essential hypertension, benign   . Mixed hyperlipidemia   . History of cardiomyopathy     LVEF 30-35% in 2003  . ESRD on hemodialysis     M W F    Past Surgical History  Procedure Laterality Date  . Coronary artery bypass graft  2003    Dr. Laneta Simmers - LIMA to LAD, SVG to OM1 and OM2, SVG to PDA  . Left arm av fistula  2003  . Perirectal surgery  2004  . Left arm av fistula thrombectomy  2007  . Left arm av fistula thrombectomy  2008  . Permanent pacemaker insertion N/A 09/01/2014    Procedure: PERMANENT PACEMAKER INSERTION;  Surgeon: Marinus Maw, MD;  Location: [facility] CATH LAB;  Service: Cardiovascular;  Laterality: N/A;  . Peripheral vascular catheterization N/A 08/25/2015    Procedure: Dialysis/Perma Catheter Insertion;  Surgeon: Annice Needy, MD;  Location: [facility] INVASIVE CV LAB;  Service: Cardiovascular;  Laterality: N/A;  . Eye surgery  2004  . Eye surgery  2005    Social History Social History  Substance Use Topics  . Smoking status: Former  Smoker    Types: Cigarettes  . Smokeless tobacco: Never Used  . Alcohol Use: No  No IVDU  Family History No known history of bleeding disorders, clotting disorders, or autoimmune diseases  No Known Allergies   REVIEW OF SYSTEMS (Negative unless checked)  Constitutional: Weight loss  Fever  Chills Cardiac: Chest pain   Chest pressure   Palpitations   Shortness of breath when laying flat   Shortness of breath at rest   Shortness of breath with exertion. Vascular:  Pain in legs with walking   Pain in legs at rest   Pain in legs when laying flat   Claudication   Pain in feet when walking  Pain in feet at rest  Pain in feet when laying flat   History of DVT   Phlebitis   Swelling in legs   Varicose veins   Non-healing ulcers Pulmonary:   Uses home oxygen   Productive cough   Hemoptysis   Wheeze  COPD   Asthma Neurologic:  Dizziness  Blackouts   Seizures   History of stroke   History of TIA  Aphasia   Temporary blindness   Dysphagia   Weakness or numbness in arms   Weakness or numbness in legs Musculoskeletal:  Arthritis   Joint swelling   Joint pain     Low back pain Hematologic:  [] Easy bruising  [] Easy bleeding   [] Hypercoagulable state   [] Anemic  [] Hepatitis Gastrointestinal:  [] Blood in stool   [] Vomiting blood  [] Gastroesophageal reflux/heartburn   [] Difficulty swallowing. Genitourinary:  [x] Chronic kidney disease   [] Difficult urination  [] Frequent urination  [] Burning with urination   [] Blood in urine Skin:  [] Rashes   [] Ulcers   [] Wounds Psychological:  [] History of anxiety   []  History of major depression.  Physical Examination  Filed Vitals:   08/31/15 0954 08/31/15 0958  BP: 129/74 129/74  Pulse: 102 84  Temp: 98.4 F (36.9 C)   TempSrc: Oral   Resp: 16   SpO2: 92% 92%   There is no weight on file to calculate BMI. Gen: WD/WN, NAD Head: [place]/AT, No temporalis wasting. Prominent temp pulse not  noted. Ear/Nose/Throat: Hearing grossly intact, nares w/o erythema or drainage, oropharynx w/o Erythema/Exudate,  Eyes: PERRLA, EOMI.  Neck: Supple, no nuchal rigidity.  No bruit or JVD.  Pulmonary:  Good air movement, clear to auscultation bilaterally, no use of accessory muscles.  Cardiac: RRR, normal S1, S2, no Murmurs, rubs or gallops. Vascular:  Vessel Right Left  Radial Palpable Palpable                                   Gastrointestinal: soft, non-tender/non-distended. No guarding/reflex.  Musculoskeletal: M/S 5/5 throughout.  Extremities without ischemic changes.  No deformity or atrophy.  Neurologic: CN 2-12 intact. Pain and light touch intact in extremities.  Symmetrical.  Speech is fluent. Motor exam as listed above. Psychiatric: Judgment intact, Mood & affect appropriate for pt's clinical situation. Dermatologic: No rashes or ulcers noted.  No cellulitis or open wounds. Lymph : No Cervical, Axillary, or Inguinal lymphadenopathy.      CBC Lab Results  Component Value Date   WBC 6.5 08/30/2015   HGB 11.9* 08/30/2015   HCT 35.4 08/30/2015   MCV 97.3 08/30/2015   PLT 180 08/30/2015    BMET    Component Value Date/Time   NA 135 08/30/2015 1215   K 4.2 08/30/2015 1215   K 3.8 07/08/2012 1159   CL 96* 08/30/2015 1215   CO2 19* 08/30/2015 1215   GLUCOSE 198* 08/30/2015 1215   BUN 97* 08/30/2015 1215   CREATININE 10.62* 08/30/2015 1215   CALCIUM 7.1* 08/30/2015 1215   GFRNONAA 3* 08/30/2015 1215   GFRAA 3* 08/30/2015 1215   Estimated Creatinine Clearance: 3.3 mL/min (by C-G formula based on Cr of 10.62).  COAG Lab Results  Component Value Date   INR 1.11 08/30/2015   INR 1.33 08/31/2014   INR 1.1 06/30/2007    Radiology No results found.    Assessment/Plan 1. ESRD: not stable access at this point 2. Dysfunction of dialysis access.  Permcath not working well.  Will exchange today   Eryka Dolinger, MD  08/31/2015 10:13 AM

## 2015-08-31 NOTE — Op Note (Signed)
OPERATIVE NOTE    PRE-OPERATIVE DIAGNOSIS: 1. ESRD 2. Non-functional permcath  POST-OPERATIVE DIAGNOSIS: same as above  PROCEDURE: 1. Fluoroscopic guidance for placement of catheter 2. Placement of a 19 cm tip to cuff tunneled hemodialysis catheter via the 19 internal jugular vein and removal or previous catheter 3. Venogram through newly placed catheter to confirm flow  SURGEON: Antwonette Feliz, MD  ANESTHESIA:  Local/MCS  ESTIMATED BLOOD LOSS: minimal  FINDING(S): none  SPECIMEN(S):  None  INDICATIONS:   Patient is a 81 y.o.female who presents with non-functional dialysis catheter and ESRD.  The patient needs long term dialysis access for their ESRD, and a Permcath is necessary.  Risks and benefits are discussed and informed consent is obtained.    DESCRIPTION: After obtaining full informed written consent, the patient was brought back to the vascular suited. The patient's existing catheter, neck and chest were sterilely prepped and draped in a sterile surgical field was created.  The existing catheter was dissected free from the fibrous sheath securing the cuff with hemostats and blunt dissection.  A wire was placed. The existing catheter was then removed and the wire used to keep venous access. I selected a 19 cm tip to cuff tunneled dialysis catheter.  Using fluoroscopic guidance the catheter tips were parked deep in the right atrium. The appropriate distal connectors were placed. It withdrew blood well and flushed easily with heparinized saline.  I did a small, 5 cc injection of contrast through the catheter to confirm flow in the right atrium and good flow was seen.  I then flushed the catheter again and a concentrated heparin solution was then placed. It was secured to the chest wall with 2 Prolene sutures. A 4-0 Monocryl pursestring suture was placed around the exit site. Sterile dressings were placed. The patient tolerated the procedure well and was taken to the recovery room in  stable condition.  COMPLICATIONS: None  CONDITION: Stable  Barbara Cummings 08/31/2015 10:59 AM

## 2015-08-31 NOTE — Discharge Instructions (Signed)
Tunneled Catheter Insertion, Care After °Refer to this sheet in the next few weeks. These instructions provide you with information on caring for yourself after your procedure. Your caregiver may also give you more specific instructions. Your treatment has been planned according to current medical practices, but problems sometimes occur. Call your caregiver if you have any problems or questions after your procedure.  °HOME CARE INSTRUCTIONS °· Rest at home the day of the procedure. You will likely be able to return to normal activities the following day. °· Follow your caregiver's specific instructions for the type of device that you have. °· Only take over-the-counter or prescription medicines as directed by your caregiver. °· Keep the insertion site of the catheter clean and dry at all times. °¨ Change the bandages (dressings) over the catheter site as directed by your caregiver. °¨ Wash the area around the catheter site during each dressing change. Sponge bathe the area using a germ-killing (antiseptic) solution as directed by your caregiver. °¨ Look for redness or swelling at the insertion site during each dressing change. °· Apply an antibiotic ointment as directed by your caregiver. °· Flush your catheter as directed to keep it from becoming clogged. °· Always wash your hands thoroughly before changing dressings or flushing the catheter. °· Do not let air enter the catheter. °¨ Never open the cap at the catheter tip. °¨ Always make sure there is no air in the syringe or in the tubing for infusions.    °· Do not lift anything heavy. °· Do not drive until your caregiver approves. °· Do not shower or bathe until your caregiver approves. When you shower or bathe, place a piece of plastic wrap over the catheter site. Do not allow the catheter site or the dressing to get wet. If taking a bath, do not allow the catheter to get submerged in the water. °If the catheter was inserted through an arm vein:  °· Avoid  wearing tight clothes or jewelry on the arm that has the catheter.   °· Do not sleep with your head on the arm that has the catheter.   °· Do not allow use of a blood pressure cuff on the arm that has the catheter.   °· Do not let anyone draw blood from the arm that has the catheter, except through the catheter itself. °SEEK MEDICAL CARE IF: °· You have bleeding at the insertion site of the catheter.   °· You feel weak or nauseous.   °· Your catheter is not working properly.   °· You have redness, pain, swelling, and warmth at the insertion site.   °· You notice fluid draining from the insertion site.   °SEEK IMMEDIATE MEDICAL CARE IF: °· Your catheter breaks or has a hole in it.   °· Your catheter comes loose or gets pulled completely out. If this happens, hold firm pressure over the area with your hand or a clean cloth.   °· You have a fever. °· You have chills.   °· Your catheter becomes totally blocked.   °· You have swelling in your arm, shoulder, neck, or face.   °· You have bleeding from the insertion site that does not stop.   °· You develop chest pain or have trouble breathing.   °· You feel dizzy or faint.   °MAKE SURE YOU: °· Understand these instructions. °· Will watch your condition. °· Will get help right away if you are not doing well or get worse. °Document Released: 11/26/2012 Document Revised: 08/12/2013 Document Reviewed: 11/26/2012 °[employer]® Patient Information ©2015 [employer]. This information is not intended to replace advice   given to you by your health care provider. Make sure you discuss any questions you have with your health care provider. ° °

## 2015-09-01 MED ORDER — CEFAZOLIN SODIUM-DEXTROSE 2-3 GM-% IV SOLR: 2.0000 g | Freq: Once | INTRAVENOUS | Status: DC

## 2015-09-05 ENCOUNTER — Emergency Department (HOSPITAL_COMMUNITY)
Admission: EM | Admit: 2015-09-05 | Discharge: 2015-09-05 | Disposition: A | Discharge status: Home / Self Care | Payer: Medicare Other | Attending: Emergency Medicine | Admitting: Emergency Medicine

## 2015-09-05 ENCOUNTER — Encounter (HOSPITAL_COMMUNITY): Payer: Self-pay

## 2015-09-05 DIAGNOSIS — N186 End stage renal disease: Secondary | ICD-10-CM | POA: Diagnosis not present

## 2015-09-05 DIAGNOSIS — I120 Hypertensive chronic kidney disease with stage 5 chronic kidney disease or end stage renal disease: Secondary | ICD-10-CM | POA: Insufficient documentation

## 2015-09-05 DIAGNOSIS — Z992 Dependence on renal dialysis: Secondary | ICD-10-CM | POA: Insufficient documentation

## 2015-09-05 DIAGNOSIS — Y9389 Activity, other specified: Secondary | ICD-10-CM | POA: Insufficient documentation

## 2015-09-05 DIAGNOSIS — Y9289 Other specified places as the place of occurrence of the external cause: Secondary | ICD-10-CM | POA: Insufficient documentation

## 2015-09-05 DIAGNOSIS — I2510 Atherosclerotic heart disease of native coronary artery without angina pectoris: Secondary | ICD-10-CM | POA: Diagnosis not present

## 2015-09-05 DIAGNOSIS — Z862 Personal history of diseases of the blood and blood-forming organs and certain disorders involving the immune mechanism: Secondary | ICD-10-CM | POA: Diagnosis not present

## 2015-09-05 DIAGNOSIS — Y998 Other external cause status: Secondary | ICD-10-CM | POA: Insufficient documentation

## 2015-09-05 DIAGNOSIS — E782 Mixed hyperlipidemia: Secondary | ICD-10-CM | POA: Insufficient documentation

## 2015-09-05 DIAGNOSIS — E119 Type 2 diabetes mellitus without complications: Secondary | ICD-10-CM | POA: Insufficient documentation

## 2015-09-05 DIAGNOSIS — Z87891 Personal history of nicotine dependence: Secondary | ICD-10-CM | POA: Insufficient documentation

## 2015-09-05 DIAGNOSIS — W108XXA Fall (on) (from) other stairs and steps, initial encounter: Secondary | ICD-10-CM | POA: Diagnosis not present

## 2015-09-05 DIAGNOSIS — Z043 Encounter for examination and observation following other accident: Secondary | ICD-10-CM | POA: Diagnosis present

## 2015-09-05 DIAGNOSIS — Z951 Presence of aortocoronary bypass graft: Secondary | ICD-10-CM | POA: Diagnosis not present

## 2015-09-05 DIAGNOSIS — W19XXXA Unspecified fall, initial encounter: Secondary | ICD-10-CM

## 2015-09-05 DIAGNOSIS — Z79899 Other long term (current) drug therapy: Secondary | ICD-10-CM | POA: Insufficient documentation

## 2015-09-05 NOTE — ED Notes (Signed)
Pt reports missed the last step getting off of the van at dialysis and fell. Pt denies any injury but says has be evaluated before having dialysis.

## 2015-09-05 NOTE — ED Provider Notes (Signed)
CSN: [id number]     Arrival date & time 09/05/15  4098 History   None    Chief Complaint  Patient presents with  . Fall     (Consider location/radiation/quality/duration/timing/severity/associated sxs/prior Treatment) HPI  This is an 81 year old female with history of coronary artery disease, anemia who presents from dialysis after a fall. Patient reports that she misstepped coming off the dialysis van and fell. She fell on her buttocks. She denies any pain. She reports that she was told that she must be evaluated in order to receive dialysis. She denies any syncope. She denies any chest pain or shortness of breath. She denies any back pain. She has been ambulatory.  Past Medical History  Diagnosis Date  . Type 2 diabetes mellitus   . Coronary atherosclerosis of native coronary artery     Multivessel s/p CABG 2003  . Chronic anemia     Capsule endoscopy 2007 - gastric erosions, small bowel telangiectasias, one small  . Essential hypertension, benign   . Mixed hyperlipidemia   . History of cardiomyopathy     LVEF 30-35% in 2003  . ESRD on hemodialysis     M W F   Past Surgical History  Procedure Laterality Date  . Coronary artery bypass graft  2003    Dr. Laneta Simmers - LIMA to LAD, SVG to OM1 and OM2, SVG to PDA  . Left arm av fistula  2003  . Perirectal surgery  2004  . Left arm av fistula thrombectomy  2007  . Left arm av fistula thrombectomy  2008  . Permanent pacemaker insertion N/A 09/01/2014    Procedure: PERMANENT PACEMAKER INSERTION;  Surgeon: Marinus Maw, MD;  Location: [facility] CATH LAB;  Service: Cardiovascular;  Laterality: N/A;  . Peripheral vascular catheterization N/A 08/25/2015    Procedure: Dialysis/Perma Catheter Insertion;  Surgeon: Annice Needy, MD;  Location: [facility] INVASIVE CV LAB;  Service: Cardiovascular;  Laterality: N/A;  . Eye surgery  2004  . Eye surgery  2005  . Peripheral vascular catheterization N/A 08/31/2015    Procedure: Dialysis/Perma Catheter Insertion;   Surgeon: Annice Needy, MD;  Location: [facility] INVASIVE CV LAB;  Service: Cardiovascular;  Laterality: N/A;  . Exchange of a dialysis catheter  08/31/2015    Procedure: Exchange Of A Dialysis Catheter;  Surgeon: Annice Needy, MD;  Location: [facility] INVASIVE CV LAB;  Service: Cardiovascular;;   Family History  Problem Relation Age of Onset  . Family history unknown: Yes   Social History  Substance Use Topics  . Smoking status: Former Smoker    Types: Cigarettes  . Smokeless tobacco: Never Used  . Alcohol Use: No   OB History    No data available     Review of Systems  Constitutional: Negative for fever.  Respiratory: Negative for chest tightness and shortness of breath.   Cardiovascular: Negative for chest pain.  Gastrointestinal: Negative for abdominal pain.  Genitourinary: Negative for dysuria.  Musculoskeletal: Negative for back pain and neck pain.  Skin: Negative for wound.  Neurological: Negative for syncope and headaches.  Psychiatric/Behavioral: Negative for confusion.  All other systems reviewed and are negative.     Allergies  Review of patient's allergies indicates no known allergies.  Home Medications   Prior to Admission medications   Medication Sig Start Date End Date Taking? Authorizing Provider  cinacalcet (SENSIPAR) 30 MG tablet Take 30 mg by mouth daily after supper.     Historical Provider, MD  glipiZIDE (GLUCOTROL) 5 MG tablet  Take by mouth daily before breakfast.    Historical Provider, MD  multivitamin (RENA-VIT) TABS tablet Take 1 tablet by mouth daily.    Historical Provider, MD  sevelamer carbonate (RENVELA) 800 MG tablet Take 1,600 mg by mouth 3 (three) times daily with meals.    Historical Provider, MD  simvastatin (ZOCOR) 40 MG tablet Take 40 mg by mouth daily.    Historical Provider, MD   BP 135/76 mmHg  Pulse 87  Temp(Src) 97.6 F (36.4 C) (Oral)  Resp 18  SpO2 96% Physical Exam  Constitutional: She is oriented to person, place, and time.   Elderly, thin  HENT:  Head: Normocephalic and atraumatic.  Eyes: Pupils are equal, round, and reactive to light.  Neck: Normal range of motion. Neck supple.  Cardiovascular: Normal rate, regular rhythm and normal heart sounds.   No murmur heard. Pulmonary/Chest: Effort normal and breath sounds normal. No respiratory distress. She has no wheezes.  Midline sternotomy scar, dialysis access in the right subclavian, pacemaker palpated  Abdominal: Soft. Bowel sounds are normal. There is no tenderness. There is no rebound and no guarding.  Musculoskeletal:  No spinal tenderness, step-off, or deformity,  Neurological: She is alert and oriented to person, place, and time.  Normal gait, unassisted  Skin: Skin is warm and dry.  Psychiatric: She has a normal mood and affect.  Nursing note and vitals reviewed.   ED Course  Procedures (including critical care time) Labs Review Labs Reviewed - No data to display  Imaging Review No results found. I have personally reviewed and evaluated these images and lab results as part of my medical decision-making.   EKG Interpretation None      MDM   Final diagnoses:  Fall, initial encounter    Patient presents following a fall. Reports a mechanical fall. Has no complaints. Was required to be evaluated to proceed with dialysis. She is no evidence of injury. She is ambulatory at her baseline. For this reason, no further workup required. Patient is medically cleared for dialysis.  After history, exam, and medical workup I feel the patient has been appropriately medically screened and is safe for discharge home. Pertinent diagnoses were discussed with the patient. Patient was given return precautions.     Shon Baton, MD 09/05/15 [phone]

## 2015-09-05 NOTE — Discharge Instructions (Signed)
You were seen today after a fall. You have no complaints. You have no obvious injuries. You were seen today as a precautionary measure given that the fall occurred at dialysis. As you have no complaints and your exam is reassuring, he will be discharged to dialysis. If you develop pain or any new or worsening symptoms she should be reevaluated.

## 2015-09-07 NOTE — OR Nursing (Signed)
SEEN BY DR Felecia Shelling 09/06/15 AND NOT CLEARED. THEY ARE REFERRING TO CARDIOLOGIST. LEFT MESSAGE ON NURSES LINE AT DR Wyn Quaker OFFICE

## 2015-09-08 ENCOUNTER — Encounter: Admission: RE | Payer: Self-pay | Source: Ambulatory Visit

## 2015-09-08 ENCOUNTER — Ambulatory Visit: Admission: RE | Admit: 2015-09-08 | Payer: Medicare Other | Source: Ambulatory Visit | Admitting: Vascular Surgery

## 2015-09-08 SURGERY — ARTERIOVENOUS (AV) FISTULA CREATION
Anesthesia: Choice | Laterality: Right

## 2015-09-08 NOTE — OR Nursing (Signed)
Patient arrived for surgery today, unaware that she was cancelled due to lack of clearance from her PCP.  Her daughter stated that no one at Dr. Vista Deck' office mentioned that she needed to see her cardiologist.  Dr. Sharrell Ku from [facility] in [place] sees the patient at [facility] in [place].  Daughter states that they did not receive a call from Dr. Kristeen Mans' office either.  I spoke with IDA at Dr. Vista Deck' office at 3pm and asked if they had made the appointment to the cardiologist and she said they had not. They did not do an EKG at this appointment but have the 2 ekgs that were sent from preadmit testing and will fax to Dr. Ladona Ridgel. She stated that the office would schedule the visit and call the daughter, Dois Davenport at [phone] to inform her of time and date.  I have called the daughter and provided her with the above information.  Once the patient has been cleared by cardiology, they need to contact Dr. Wyn Quaker to have surgery rescheduled.  Dr. Vista Deck' office returned the call to the daughter and she is set up for an appt on 09/22/15 at 2:40pm.

## 2015-09-16 ENCOUNTER — Ambulatory Visit: Payer: Medicare Other | Admitting: Cardiology

## 2015-09-20 ENCOUNTER — Encounter: Payer: Self-pay | Admitting: *Deleted

## 2015-09-22 ENCOUNTER — Encounter: Payer: Self-pay | Admitting: Cardiology

## 2015-09-22 ENCOUNTER — Ambulatory Visit (INDEPENDENT_AMBULATORY_CARE_PROVIDER_SITE_OTHER): Payer: Medicare Other | Admitting: Cardiology

## 2015-09-22 VITALS — BP 122/57 | HR 71 | Ht 67.0 in | Wt 126.4 lb

## 2015-09-22 DIAGNOSIS — I442 Atrioventricular block, complete: Secondary | ICD-10-CM | POA: Diagnosis not present

## 2015-09-22 DIAGNOSIS — Z992 Dependence on renal dialysis: Secondary | ICD-10-CM

## 2015-09-22 DIAGNOSIS — E785 Hyperlipidemia, unspecified: Secondary | ICD-10-CM

## 2015-09-22 DIAGNOSIS — Z01818 Encounter for other preprocedural examination: Secondary | ICD-10-CM

## 2015-09-22 DIAGNOSIS — I2510 Atherosclerotic heart disease of native coronary artery without angina pectoris: Secondary | ICD-10-CM

## 2015-09-22 DIAGNOSIS — Z950 Presence of cardiac pacemaker: Secondary | ICD-10-CM

## 2015-09-22 DIAGNOSIS — I10 Essential (primary) hypertension: Secondary | ICD-10-CM

## 2015-09-22 DIAGNOSIS — N186 End stage renal disease: Secondary | ICD-10-CM

## 2015-09-22 NOTE — Patient Instructions (Signed)

## 2015-09-22 NOTE — Progress Notes (Signed)
Cardiology Office Note  Date: 09/22/2015   ID: Barbara Cummings, DOB 06/07/1926, MRN [id number]  PCP: Barbara Gully, MD  Primary Cardiologist: Barbara Dell, MD   Chief Complaint  Patient presents with  . Coronary Artery Disease  . Cardiomyopathy  . Preoperative evaluation    History of Present Illness: Barbara Cummings is an 81 y.o. female that I saw in consultation in September 2015 with evidence of symptomatic high degree heart block necessitating urgent transfer to [facility] for EP intervention. She is status post St. Jude pacemaker placement by Barbara Cummings at that time, and has been following with him since. Additional cardiac history is outlined below.  She is referred to the office today by Dr. Felecia Cummings for preoperative consultation. I received no other records for review. Through the information in EPIC, it looks like she was to undergo revision of a right arm AV fistula with placement of new access for hemodialysis by Dr. Wyn Cummings in [place]. Surgery was canceled on September 15 due to abnormal ECG and lack of preoperative clearance by Dr. Felecia Cummings. She already underwent placement of a tunneled hemodialysis catheter via the right IJ earlier this month by Dr. Wyn Cummings.  Barbara Cummings is here with a family member. She continues to undergo hemodialysis on Monday, Wednesday, and Friday. She does not report any breathlessness or angina with these sessions, and is able to do basic ADLs including walking with her cane without angina. She has had no dizziness or syncope. She also has remote pacemaker interrogation at home which is followed by Barbara Cummings.  I reviewed her recent ECG which shows a ventricular paced rhythm with underlying sinus rhythm and atrial tracking.  She has not had a recent reevaluation of LVEF, known history of cardiomyopathy as outlined below.   Past Medical History  Diagnosis Date  . Type 2 diabetes mellitus   . Coronary atherosclerosis of native coronary artery    Multivessel s/p CABG 2003  . Chronic anemia     Capsule endoscopy 2007 - gastric erosions, small bowel telangiectasias, one small  . Essential hypertension, benign   . Mixed hyperlipidemia   . History of cardiomyopathy     LVEF 30-35% in 2003  . ESRD on hemodialysis     M W F    Past Surgical History  Procedure Laterality Date  . Coronary artery bypass graft  2003    Dr. Laneta Cummings - LIMA to LAD, SVG to OM1 and OM2, SVG to PDA  . Left arm av fistula  2003  . Perirectal surgery  2004  . Left arm av fistula thrombectomy  2007  . Left arm av fistula thrombectomy  2008  . Permanent pacemaker insertion N/A 09/01/2014    Procedure: PERMANENT PACEMAKER INSERTION;  Surgeon: Barbara Maw, MD;  Location: [facility] CATH LAB;  Service: Cardiovascular;  Laterality: N/A;  . Peripheral vascular catheterization N/A 08/25/2015    Procedure: Dialysis/Perma Catheter Insertion;  Surgeon: Barbara Needy, MD;  Location: [facility] INVASIVE CV LAB;  Service: Cardiovascular;  Laterality: N/A;  . Eye surgery  2004  . Eye surgery  2005  . Peripheral vascular catheterization N/A 08/31/2015    Procedure: Dialysis/Perma Catheter Insertion;  Surgeon: Barbara Needy, MD;  Location: [facility] INVASIVE CV LAB;  Service: Cardiovascular;  Laterality: N/A;  . Exchange of a dialysis catheter  08/31/2015    Procedure: Exchange Of A Dialysis Catheter;  Surgeon: Barbara Needy, MD;  Location: [facility] INVASIVE CV LAB;  Service: Cardiovascular;;    Current  Outpatient Prescriptions  Medication Sig Dispense Refill  . cinacalcet (SENSIPAR) 30 MG tablet Take 30 mg by mouth daily after supper.     Marland Kitchen glipiZIDE (GLUCOTROL) 5 MG tablet Take by mouth daily before breakfast.    . multivitamin (RENA-VIT) TABS tablet Take 1 tablet by mouth daily.    . sevelamer carbonate (RENVELA) 800 MG tablet Take 1,600 mg by mouth 3 (three) times daily with meals.    . simvastatin (ZOCOR) 40 MG tablet Take 40 mg by mouth daily.     No current facility-administered medications for  this visit.    Allergies:  Review of patient's allergies indicates no known allergies.   Social History: The patient  reports that she has quit smoking. Her smoking use included Cigarettes. She has never used smokeless tobacco. She reports that she does not drink alcohol or use illicit drugs.   ROS:  Please see the history of present illness. Otherwise, complete review of systems is positive for decreased hearing.  All other systems are reviewed and negative.   Physical Exam: VS:  BP 122/57 mmHg  Pulse 71  Ht 5\' 7"  (1.702 m)  Wt 126 lb 6.4 oz (57.335 kg)  BMI 19.79 kg/m2  SpO2 96%, BMI Body mass index is 19.79 kg/(m^2).  Wt Readings from Last 3 Encounters:  09/22/15 126 lb 6.4 oz (57.335 kg)  08/30/15 125 lb (56.7 kg)  08/25/15 125 lb 10.6 oz (57 kg)     General: Elderly woman, appears comfortable at rest. HEENT: Conjunctiva and lids normal, oropharynx clear. Neck: Supple, no elevated JVP, no thyromegaly. Tunneled, dressed hemodialysis catheter on the right Lungs: Clear to auscultation, nonlabored breathing at rest. Cardiac: Fairly prominent referred diastolic and systolic sounds from dialysis shunt into chest wall. Regular rate and rhythm, no S3, no pericardial rub. Abdomen: Soft, nontender, bowel sounds present. Extremities: No pitting edema, distal pulses 1-2+. Very prominent and bulbous right arm AV fistula. Skin: Warm and dry. Musculoskeletal: Mild kyphosis. Neuropsychiatric: Alert and oriented x3, affect grossly appropriate. Diminished hearing.   ECG: Recent tracing from September 6 showed a ventricular paced rhythm with atrial tracking.   Recent Labwork: 08/30/2015: BUN 97*; Creatinine, Ser 10.62*; Hemoglobin 11.9*; Platelets 180; Potassium 4.2; Sodium 135   Assessment and Plan:  1. Preoperative evaluation in an 81 year old woman with history of multivessel CAD status post CABG in 2003, ischemic cardiomyopathy with LVEF 30-35%, complete heart block status post St. Jude  pacemaker in September of last year by Barbara Cummings, end-stage renal disease on hemodialysis, hyperlipidemia, and hypertension. She is being considered for right arm AV fistula revision by Dr. Wyn Cummings in [place] under general anesthesia. Based on her age and morbidities alone, surgical risk will be increased, at least in the intermediate range, although not prohibitive to proceed based on lack of significant angina symptoms and relative clinical stability with hemodialysis. ECG is consistent with ventricular pacing. She has a history of complete heart block and paces 99% of the time based on Dr. Lubertha Basque last note. It will be important in the perioperative setting not to deactivate or interrupt regular pacing. We will obtain an echocardiogram to re-documented in LVEF as this may be useful and management during the perioperative setting. Otherwise would not pursue any ischemic testing at this time in the absence of active angina.  2. Complete heart block status post St. Jude pacemaker placement, followed by Barbara Cummings. Forwarding note him to make sure that he has no other input regarding perioperative pacemaker management strategies.  3.  End-stage renal disease on hemodialysis. Currently using a temporary tunneled right IJ catheter.  4. Hypertension, blood pressure control is good today.  5. Hyperlipidemia, on Zocor.  Current medicines were reviewed with the patient today.   Orders Placed This Encounter  Procedures  . ECHOCARDIOGRAM COMPLETE    Disposition: FU with me in 6 months.   Signed, Jonelle Sidle, MD, [facility] 09/22/2015 3:11 PM    [facility] [address] [place], [place], [place] [address] Phone: [phone]; Fax: [phone]

## 2015-09-27 ENCOUNTER — Ambulatory Visit (HOSPITAL_COMMUNITY)
Admission: RE | Admit: 2015-09-27 | Discharge: 2015-09-27 | Disposition: A | Discharge status: Home / Self Care | Payer: Medicare Other | Source: Ambulatory Visit | Attending: Cardiology | Admitting: Cardiology

## 2015-09-27 DIAGNOSIS — E119 Type 2 diabetes mellitus without complications: Secondary | ICD-10-CM | POA: Diagnosis not present

## 2015-09-27 DIAGNOSIS — Z87891 Personal history of nicotine dependence: Secondary | ICD-10-CM | POA: Insufficient documentation

## 2015-09-27 DIAGNOSIS — I10 Essential (primary) hypertension: Secondary | ICD-10-CM | POA: Insufficient documentation

## 2015-09-27 DIAGNOSIS — I2510 Atherosclerotic heart disease of native coronary artery without angina pectoris: Secondary | ICD-10-CM | POA: Insufficient documentation

## 2015-09-27 DIAGNOSIS — Z01818 Encounter for other preprocedural examination: Secondary | ICD-10-CM

## 2015-10-26 ENCOUNTER — Emergency Department (HOSPITAL_COMMUNITY): Payer: Medicare Other

## 2015-10-26 ENCOUNTER — Encounter (HOSPITAL_COMMUNITY): Payer: Self-pay

## 2015-10-26 ENCOUNTER — Emergency Department (HOSPITAL_COMMUNITY)
Admission: EM | Admit: 2015-10-26 | Discharge: 2015-11-24 | Disposition: E | Payer: Medicare Other | Attending: Emergency Medicine | Admitting: Emergency Medicine

## 2015-10-26 DIAGNOSIS — I469 Cardiac arrest, cause unspecified: Secondary | ICD-10-CM | POA: Insufficient documentation

## 2015-10-26 DIAGNOSIS — N186 End stage renal disease: Secondary | ICD-10-CM | POA: Diagnosis not present

## 2015-10-26 DIAGNOSIS — Z992 Dependence on renal dialysis: Secondary | ICD-10-CM | POA: Diagnosis not present

## 2015-10-26 DIAGNOSIS — E785 Hyperlipidemia, unspecified: Secondary | ICD-10-CM | POA: Insufficient documentation

## 2015-10-26 DIAGNOSIS — I2510 Atherosclerotic heart disease of native coronary artery without angina pectoris: Secondary | ICD-10-CM | POA: Insufficient documentation

## 2015-10-26 DIAGNOSIS — E119 Type 2 diabetes mellitus without complications: Secondary | ICD-10-CM | POA: Diagnosis not present

## 2015-10-26 DIAGNOSIS — Z87891 Personal history of nicotine dependence: Secondary | ICD-10-CM | POA: Insufficient documentation

## 2015-10-26 DIAGNOSIS — Z951 Presence of aortocoronary bypass graft: Secondary | ICD-10-CM | POA: Insufficient documentation

## 2015-10-26 DIAGNOSIS — D62 Acute posthemorrhagic anemia: Secondary | ICD-10-CM | POA: Diagnosis not present

## 2015-10-26 DIAGNOSIS — R571 Hypovolemic shock: Secondary | ICD-10-CM | POA: Diagnosis not present

## 2015-10-26 DIAGNOSIS — Z79899 Other long term (current) drug therapy: Secondary | ICD-10-CM | POA: Diagnosis not present

## 2015-10-26 DIAGNOSIS — I120 Hypertensive chronic kidney disease with stage 5 chronic kidney disease or end stage renal disease: Secondary | ICD-10-CM | POA: Diagnosis not present

## 2015-10-26 LAB — CBC WITH DIFFERENTIAL/PLATELET
BASOS ABS: 0 10*3/uL (ref 0.0–0.1)
Basophils Relative: 0 %
Eosinophils Absolute: 0 10*3/uL (ref 0.0–0.7)
Eosinophils Relative: 1 %
HEMATOCRIT: 21.6 % — AB (ref 36.0–46.0)
HEMOGLOBIN: 6.8 g/dL — AB (ref 12.0–15.0)
LYMPHS ABS: 2.8 10*3/uL (ref 0.7–4.0)
LYMPHS PCT: 59 %
MCH: 32.7 pg (ref 26.0–34.0)
MCHC: 31.5 g/dL (ref 30.0–36.0)
MCV: 103.8 fL — AB (ref 78.0–100.0)
Monocytes Absolute: 0.2 10*3/uL (ref 0.1–1.0)
Monocytes Relative: 4 %
NEUTROS ABS: 1.8 10*3/uL (ref 1.7–7.7)
NEUTROS PCT: 37 %
Platelets: 66 10*3/uL — ABNORMAL LOW (ref 150–400)
RBC: 2.08 MIL/uL — AB (ref 3.87–5.11)
RDW: 14.4 % (ref 11.5–15.5)
WBC: 4.8 10*3/uL (ref 4.0–10.5)

## 2015-10-26 MED ORDER — EPINEPHRINE HCL 0.1 MG/ML IJ SOSY
PREFILLED_SYRINGE | INTRAMUSCULAR | Status: AC | PRN
  Administered 2015-10-26 (×2): 1 mg via INTRAVENOUS

## 2015-10-26 MED ORDER — CALCIUM CHLORIDE 10 % IV SOLN
1.0000 g | Freq: Once | INTRAVENOUS | Status: AC
  Administered 2015-10-26: 1 g via INTRAVENOUS

## 2015-10-26 MED ORDER — SODIUM BICARBONATE 8.4 % IV SOLN
50.0000 meq | Freq: Once | INTRAVENOUS | Status: AC
  Administered 2015-10-26: 50 meq via INTRAVENOUS

## 2015-10-26 MED ORDER — SODIUM BICARBONATE 8.4 % IV SOLN
INTRAVENOUS | Status: AC | PRN
  Administered 2015-10-26: 50 meq via INTRAVENOUS

## 2015-10-27 LAB — TYPE AND SCREEN
ABO/RH(D): B POS
Antibody Screen: NEGATIVE
UNIT DIVISION: 0

## 2015-10-31 MED FILL — Medication: Qty: 1 | Status: AC

## 2015-11-01 ENCOUNTER — Other Ambulatory Visit: Payer: Medicare Other

## 2015-11-11 ENCOUNTER — Ambulatory Visit: Admit: 2015-11-11 | Payer: Self-pay | Admitting: Vascular Surgery

## 2015-11-11 SURGERY — ARTERIOVENOUS (AV) FISTULA CREATION
Anesthesia: Choice | Laterality: Right

## 2015-11-21 ENCOUNTER — Encounter: Payer: Medicare Other | Admitting: Internal Medicine

## 2015-11-24 NOTE — Code Documentation (Signed)
Family updated as to patient's status.

## 2015-11-24 NOTE — Code Documentation (Signed)
Pt vfib, preparing to shock.

## 2015-11-24 NOTE — ED Notes (Addendum)
Barbara Cummings, [facility] RN contacted [name], [place] at [name] and pt was cleared for ME case.

## 2015-11-24 NOTE — ED Notes (Signed)
Barbara Cummings, [facility] RN contacted [employer] at [phone] and spoke with Dionne Ano. Reference number [id number].

## 2015-11-24 NOTE — Code Documentation (Signed)
Compressions continued

## 2015-11-24 NOTE — Code Documentation (Signed)
Compressions started.

## 2015-11-24 NOTE — ED Notes (Signed)
Per EMS was called out for dialysis shunt bleeding after pt changed her shirt.  EMS reports copious amounts of blood in the floor and pt unresponsive.  EMS reports initial rhythm PEA.  EMS started CPR.  Pt has a king airway, 6mm IO R tibia.  EMS administered normal saline.   EMS administered acls for approx then pt had ROSC.  EMS administered epi, bicarb, narcan, and calcium in addition to fluid bolus and was defibrillated x 3 because of vfib.  PT arrived unresponsive but has a  Femoral pulse.

## 2015-11-24 NOTE — Code Documentation (Signed)
Per Dr. Hyacinth Meeker, no femoral pulse, cpr continued.

## 2015-11-24 NOTE — Code Documentation (Signed)
No pulse per Dr. Fayrene Fearing.  Epi being adminsitered, blood continuing to infuse.

## 2015-11-24 NOTE — Code Documentation (Signed)
Pt asystole on monitor, no cardiac activity seen with ultrasound, no pulse.  Dr. Fayrene Fearing called time of death at 32.

## 2015-11-24 NOTE — ED Notes (Signed)
Dr. Fayrene Fearing preparing to intubate.  King airway removed.

## 2015-11-24 NOTE — ED Notes (Signed)
Per ems cbg 93, and bp 75/52, hr 99.  EMS administered total of 6mg  epi IO, 2mg  narcan, amp bicarb, and amp of calcium chloride.

## 2015-11-24 NOTE — ED Notes (Signed)
Dr. Hyacinth Meeker attempting central line placement.

## 2015-11-24 NOTE — ED Notes (Signed)
Unit number on 0neg blood that was administered is [id number] [phone]

## 2015-11-24 NOTE — ED Notes (Signed)
Rapid infusion of O neg blood infusing.  Started by Fransico Him RN and verified with Tonia Ghent RN.

## 2015-11-24 NOTE — Code Documentation (Signed)
Patient time of death occurred at 35

## 2015-11-24 NOTE — Code Documentation (Signed)
Pt shocked at 200j, compressions continuing.

## 2015-11-24 NOTE — ED Provider Notes (Addendum)
CSN: [id number]     Arrival date & time 11/07/2015  1653 History   First MD Initiated Contact with Patient 11/10/2015 1743     Chief Complaint  Patient presents with  . Cardiac Arrest  . acute blood loss      HPI  Patient presents via EMS in cardiac arrest.  History of end-stage renal disease. Had dialysis today. Has a failed AV graft in her right arm, temporary permacath in her right subclavian.  Was taking her shirt off today when the AV fistula right arm became disrupted and she hemorrhaged. Daughter was sitting next to her and states she had almost instant syncope. Had marked blood loss on arrival of paramedics. Pressure was applied ultimately turning it was placed. She was in asystole. Was given IV fluids via intraosseous line. Given multiple doses IV epinephrine and had return of spontaneous circulation and transferred here.  Past Medical History  Diagnosis Date  . Type 2 diabetes mellitus (HCC)   . Coronary atherosclerosis of native coronary artery     Multivessel s/p CABG 2003  . Chronic anemia     Capsule endoscopy 2007 - gastric erosions, small bowel telangiectasias, one small  . Essential hypertension, benign   . Mixed hyperlipidemia   . History of cardiomyopathy     LVEF 30-35% in 2003  . ESRD on hemodialysis [facility])     M W F   Past Surgical History  Procedure Laterality Date  . Coronary artery bypass graft  2003    Dr. Laneta Simmers - LIMA to LAD, SVG to OM1 and OM2, SVG to PDA  . Left arm av fistula  2003  . Perirectal surgery  2004  . Left arm av fistula thrombectomy  2007  . Left arm av fistula thrombectomy  2008  . Permanent pacemaker insertion N/A 09/01/2014    Procedure: PERMANENT PACEMAKER INSERTION;  Surgeon: Marinus Maw, MD;  Location: [facility] [place] CATH LAB;  Service: Cardiovascular;  Laterality: N/A;  . Peripheral vascular catheterization N/A 08/25/2015    Procedure: Dialysis/Perma Catheter Insertion;  Surgeon: Annice Needy, MD;  Location: [facility] INVASIVE CV LAB;  Service:  Cardiovascular;  Laterality: N/A;  . Eye surgery  2004  . Eye surgery  2005  . Peripheral vascular catheterization N/A 08/31/2015    Procedure: Dialysis/Perma Catheter Insertion;  Surgeon: Annice Needy, MD;  Location: [facility] INVASIVE CV LAB;  Service: Cardiovascular;  Laterality: N/A;  . Exchange of a dialysis catheter  08/31/2015    Procedure: Exchange Of A Dialysis Catheter;  Surgeon: Annice Needy, MD;  Location: [facility] INVASIVE CV LAB;  Service: Cardiovascular;;   Family History  Problem Relation Age of Onset  . Family history unknown: Yes   Social History  Substance Use Topics  . Smoking status: Former Smoker    Types: Cigarettes  . Smokeless tobacco: Never Used  . Alcohol Use: No   OB History    No data available     Review of Systems  Unable to perform ROS: Acuity of condition      Allergies  Review of patient's allergies indicates no known allergies.  Home Medications   Prior to Admission medications   Medication Sig Start Date End Date Taking? Authorizing Provider  cinacalcet (SENSIPAR) 30 MG tablet Take 30 mg by mouth daily after supper.     Historical Provider, MD  glipiZIDE (GLUCOTROL) 5 MG tablet Take by mouth daily before breakfast.    Historical Provider, MD  multivitamin (RENA-VIT) TABS tablet Take 1 tablet  by mouth daily.    Historical Provider, MD  sevelamer carbonate (RENVELA) 800 MG tablet Take 1,600 mg by mouth 3 (three) times daily with meals.    Historical Provider, MD  simvastatin (ZOCOR) 40 MG tablet Take 40 mg by mouth daily.    Historical Provider, MD   BP 60/32 mmHg  Pulse 92  Resp 17  SpO2 100% Physical Exam  Constitutional:  Unconscious, unresponsive, King airway in place, conjunctiva and tongue and gingiva pale.  HENT:  Atraumatic  Eyes:  Pupils 2 mm, unresponsive  Neck:  No JVD. Flat neck veins  Cardiovascular:  Wide complex rhythm on the monitor. Palpable pulses. Intermittent paced rhythm.  AV graft and right upper arm has eroded through  the skin. Upon visualization there is a defect in the skin 1 x 2 cm with pulsatile blood pressure is removed. I replaced gauze, Ace wrap, and tourniquet.  Pulmonary/Chest:  Breath sounds symmetric.  Abdominal:  Soft,  Neurological:  GCS 3, intubated.    ED Course  Procedures (including critical care time) Labs Review Labs Reviewed  CBC WITH DIFFERENTIAL/PLATELET - Abnormal; Notable for the following:    RBC 2.08 (*)    Hemoglobin 6.8 (*)    HCT 21.6 (*)    MCV 103.8 (*)    Platelets 66 (*)    All other components within normal limits  I-STAT CHEM 8, ED  TYPE AND SCREEN    Imaging Review Dg Chest Portable 1 View  11-21-2015  CLINICAL DATA:  CPR, acute blood loss due to burst RIGHT upper extremity fistula, diabetes mellitus, hypertension, end- stage renal disease on dialysis EXAM: PORTABLE CHEST 1 VIEW COMPARISON:  Portable exam 1719 hours compared to 09/02/2014 FINDINGS: Tip of endotracheal tube projects 6.3 cm above carina. RIGHT jugular dual-lumen central venous catheter with tip projecting over cavoatrial junction. LEFT subclavian sequential pacemaker leads project over RIGHT atrium and RIGHT ventricle. External pacing leads project over chest. Normal heart size, mediastinal contours and pulmonary vascularity. Lungs appear emphysematous but clear. No pleural effusion or pneumothorax. No acute osseous findings. IMPRESSION: Line and tube positions as above. No acute abnormalities. Electronically Signed   By: Ulyses Southward M.D.   On: 21-Nov-2015 17:39   I have personally reviewed and evaluated these images and lab results as part of my medical decision-making.   EKG Interpretation None      MDM   Final diagnoses:  Cardiac arrest [facility])   Patient given 2 units O- blood via pressure bag, indication hemorrhage, hypovolemic shock, cardiac arrest secondary to above.   INTUBATION Performed by: Claudean Kinds  Required items: required blood products, implants, devices, and  special equipment available Patient identity confirmed: provided demographic data and hospital-assigned identification number Time out: Immediately prior to procedure a "time out" was called to verify the correct patient, procedure, equipment, support staff and site/side marked as required.  Indications: Cardiac arrest, King airway  Intubation method: Direct Laryngoscopy   Preoxygenation: BVM  Sedatives: None Paralyttic: Non3  Tube Size: 7.5 cuffed  Post-procedure assessment: chest rise and ETCO2 monitor Breath sounds: equal and absent over the epigastrium Tube secured with: ETT holder Chest x-ray interpreted by radiologist and me.  Chest x-ray findings: endotracheal tube in appropriate position  Patient tolerated the procedure well with no immediate complications.        CRITICAL CARE Performed by: Rolland Porter JOSEPH   Total critical care time: 30 minutes  Critical care time was exclusive of separately billable procedures and treating other  patients.  Critical care was necessary to treat or prevent imminent or life-threatening deterioration.  Critical care was time spent personally by me on the following activities: development of treatment plan with patient and/or surrogate as well as nursing, discussions with consultants, evaluation of patient's response to treatment, examination of patient, obtaining history from patient or surrogate, ordering and performing treatments and interventions, ordering and review of laboratory studies, ordering and review of radiographic studies, pulse oximetry and re-evaluation of patient's condition. Care   Patient had deterioration in her pulses and pressures, diminished heart rate down into the 40s. Given additional epinephrine. CPR was resumed. Despite this never had return of spontaneous circulation. Resuscitative efforts terminated at 1730 4 PM. Family present and notified.   19:20:  I spoke with and notified Dr. Juanetta Gosling, (on call  for Pts PCP Dr. Felecia Shelling) of the pts demise.  Rolland Porter, MD 11/15/2015 1836  Rolland Porter, MD 11/02/2015 [phone]

## 2015-11-24 NOTE — ED Notes (Addendum)
Dr. Fayrene Fearing intubated pt with size 7.5 ett mac 3, visual, x 2 attempts and secured at 22cm at lip.  Positive color change on co2 detector, and bilateral breath sounds auscultated by Dr. Hyacinth Meeker.

## 2015-11-24 NOTE — Code Documentation (Signed)
Dr. Fayrene Fearing using ultra sound to detect cardiac activity.  No cardiac activity noted per Dr. Fayrene Fearing.

## 2015-11-24 NOTE — ED Notes (Signed)
Dr. Fayrene Fearing ordered 2 units O neg blood to be infused rapidly with pressure bag.

## 2015-11-24 DEATH — deceased
